# Patient Record
Sex: Female | Born: 1970
Health system: Southern US, Community
[De-identification: ages and names within clinical notes are randomized; demographics above are authoritative.]

## PROBLEM LIST (undated history)

## (undated) DIAGNOSIS — D649 Anemia, unspecified: Secondary | ICD-10-CM

## (undated) DIAGNOSIS — K4090 Unilateral inguinal hernia, without obstruction or gangrene, not specified as recurrent: Secondary | ICD-10-CM

## (undated) DIAGNOSIS — N200 Calculus of kidney: Secondary | ICD-10-CM

## (undated) HISTORY — DX: Unilateral inguinal hernia, without obstruction or gangrene, not specified as recurrent: K40.90

## (undated) HISTORY — DX: Calculus of kidney: N20.0

---

## 2017-09-10 DIAGNOSIS — M545 Low back pain: Secondary | ICD-10-CM | POA: Diagnosis not present

## 2018-04-07 DIAGNOSIS — M545 Low back pain: Secondary | ICD-10-CM | POA: Diagnosis not present

## 2019-07-10 DIAGNOSIS — N7093 Salpingitis and oophoritis, unspecified: Secondary | ICD-10-CM

## 2019-07-10 HISTORY — DX: Salpingitis and oophoritis, unspecified: N70.93

## 2019-07-25 ENCOUNTER — Inpatient Hospital Stay (HOSPITAL_COMMUNITY): Payer: BC Managed Care – PPO

## 2019-07-25 ENCOUNTER — Inpatient Hospital Stay (HOSPITAL_COMMUNITY)
Admission: EM | Admit: 2019-07-25 | Discharge: 2019-07-27 | DRG: 759 | Disposition: A | Discharge status: Home / Self Care | Payer: BC Managed Care – PPO | Attending: Obstetrics & Gynecology | Admitting: Obstetrics & Gynecology

## 2019-07-25 ENCOUNTER — Emergency Department (HOSPITAL_COMMUNITY): Payer: BC Managed Care – PPO

## 2019-07-25 ENCOUNTER — Encounter (HOSPITAL_COMMUNITY): Payer: Self-pay

## 2019-07-25 ENCOUNTER — Other Ambulatory Visit: Payer: Self-pay

## 2019-07-25 DIAGNOSIS — D219 Benign neoplasm of connective and other soft tissue, unspecified: Secondary | ICD-10-CM | POA: Diagnosis present

## 2019-07-25 DIAGNOSIS — D649 Anemia, unspecified: Secondary | ICD-10-CM | POA: Diagnosis not present

## 2019-07-25 DIAGNOSIS — Z880 Allergy status to penicillin: Secondary | ICD-10-CM

## 2019-07-25 DIAGNOSIS — N83201 Unspecified ovarian cyst, right side: Secondary | ICD-10-CM | POA: Diagnosis not present

## 2019-07-25 DIAGNOSIS — Z20828 Contact with and (suspected) exposure to other viral communicable diseases: Secondary | ICD-10-CM | POA: Diagnosis not present

## 2019-07-25 DIAGNOSIS — N7093 Salpingitis and oophoritis, unspecified: Principal | ICD-10-CM

## 2019-07-25 DIAGNOSIS — R109 Unspecified abdominal pain: Secondary | ICD-10-CM | POA: Diagnosis not present

## 2019-07-25 DIAGNOSIS — D259 Leiomyoma of uterus, unspecified: Secondary | ICD-10-CM | POA: Diagnosis not present

## 2019-07-25 DIAGNOSIS — N920 Excessive and frequent menstruation with regular cycle: Secondary | ICD-10-CM | POA: Diagnosis not present

## 2019-07-25 HISTORY — DX: Anemia, unspecified: D64.9

## 2019-07-25 LAB — COMPREHENSIVE METABOLIC PANEL
ALT: 39 U/L (ref 0–44)
AST: 40 U/L (ref 15–41)
Albumin: 3.7 g/dL (ref 3.5–5.0)
Alkaline Phosphatase: 82 U/L (ref 38–126)
Anion gap: 13 (ref 5–15)
BUN: 10 mg/dL (ref 6–20)
CO2: 20 mmol/L — ABNORMAL LOW (ref 22–32)
Calcium: 9.2 mg/dL (ref 8.9–10.3)
Chloride: 101 mmol/L (ref 98–111)
Creatinine, Ser: 0.48 mg/dL (ref 0.44–1.00)
GFR calc Af Amer: >60 mL/min (ref >60)
GFR calc non Af Amer: >60 mL/min (ref >60)
Glucose, Bld: 114 mg/dL — ABNORMAL HIGH (ref 70–99)
Potassium: 3.6 mmol/L (ref 3.5–5.1)
Sodium: 134 mmol/L — ABNORMAL LOW (ref 135–145)
Total Bilirubin: 0.9 mg/dL (ref 0.3–1.2)
Total Protein: 6.9 g/dL (ref 6.5–8.1)

## 2019-07-25 LAB — URINALYSIS, ROUTINE W REFLEX MICROSCOPIC
Bilirubin Urine: NEGATIVE
Glucose, UA: NEGATIVE mg/dL
Ketones, ur: 80 mg/dL — AB
Leukocytes,Ua: NEGATIVE
Nitrite: NEGATIVE
Protein, ur: 30 mg/dL — AB
Specific Gravity, Urine: 1.02 (ref 1.005–1.030)
pH: 5 (ref 5.0–8.0)

## 2019-07-25 LAB — LACTIC ACID, PLASMA
Lactic Acid, Venous: 1.3 mmol/L (ref 0.5–1.9)
Lactic Acid, Venous: 1.2 mmol/L (ref 0.5–1.9)

## 2019-07-25 LAB — ABO/RH: ABO/RH(D): O POS

## 2019-07-25 LAB — LIPASE, BLOOD: Lipase: 22 U/L (ref 11–51)

## 2019-07-25 LAB — WET PREP, GENITAL
Clue Cells Wet Prep HPF POC: NONE SEEN
Sperm: NONE SEEN
Trich, Wet Prep: NONE SEEN
Yeast Wet Prep HPF POC: NONE SEEN

## 2019-07-25 LAB — CBC
HCT: 30.6 % — ABNORMAL LOW (ref 36.0–46.0)
Hemoglobin: 7.8 g/dL — ABNORMAL LOW (ref 12.0–15.0)
MCH: 17.7 pg — ABNORMAL LOW (ref 26.0–34.0)
MCHC: 25.5 g/dL — ABNORMAL LOW (ref 30.0–36.0)
MCV: 69.4 fL — ABNORMAL LOW (ref 80.0–100.0)
Platelets: 352 10*3/uL (ref 150–400)
RBC: 4.41 MIL/uL (ref 3.87–5.11)
RDW: 18.3 % — ABNORMAL HIGH (ref 11.5–15.5)
WBC: 24.6 10*3/uL — ABNORMAL HIGH (ref 4.0–10.5)
nRBC: 0 % (ref 0.0–0.2)

## 2019-07-25 LAB — I-STAT BETA HCG BLOOD, ED (MC, WL, AP ONLY): I-stat hCG, quantitative: 82.1 m[IU]/mL — ABNORMAL HIGH (ref <5)

## 2019-07-25 LAB — PREPARE RBC (CROSSMATCH)

## 2019-07-25 LAB — SARS CORONAVIRUS 2 (TAT 6-24 HRS): SARS Coronavirus 2: NEGATIVE

## 2019-07-25 LAB — PREGNANCY, URINE: Preg Test, Ur: NEGATIVE

## 2019-07-25 MED ORDER — CLINDAMYCIN PHOSPHATE 900 MG/50ML IV SOLN
900.0000 mg | Freq: Three times a day (TID) | INTRAVENOUS | Status: DC
  Administered 2019-07-25 – 2019-07-27 (×6): 900 mg via INTRAVENOUS
  Filled 2019-07-25 (×6): qty 50

## 2019-07-25 MED ORDER — SODIUM CHLORIDE 0.9 % IV BOLUS
1000.0000 mL | Freq: Once | INTRAVENOUS | Status: AC
  Administered 2019-07-25: 1000 mL via INTRAVENOUS

## 2019-07-25 MED ORDER — MORPHINE SULFATE (PF) 4 MG/ML IV SOLN
4.0000 mg | Freq: Once | INTRAVENOUS | Status: AC
  Administered 2019-07-25: 4 mg via INTRAVENOUS
  Filled 2019-07-25: qty 1

## 2019-07-25 MED ORDER — ZOLPIDEM TARTRATE 5 MG PO TABS: 5.0000 mg | ORAL_TABLET | Freq: Every evening | ORAL | Status: DC | PRN

## 2019-07-25 MED ORDER — LACTATED RINGERS IV SOLN
INTRAVENOUS | Status: DC
  Administered 2019-07-25 – 2019-07-27 (×3): via INTRAVENOUS

## 2019-07-25 MED ORDER — GENTAMICIN SULFATE 40 MG/ML IJ SOLN
330.0000 mg | INTRAVENOUS | Status: DC
  Administered 2019-07-25 – 2019-07-26 (×2): 330 mg via INTRAVENOUS
  Filled 2019-07-25 (×4): qty 8.25

## 2019-07-25 MED ORDER — KETOROLAC TROMETHAMINE 30 MG/ML IJ SOLN
30.0000 mg | Freq: Four times a day (QID) | INTRAMUSCULAR | Status: AC
  Administered 2019-07-25 – 2019-07-26 (×3): 30 mg via INTRAVENOUS
  Filled 2019-07-25 (×2): qty 1

## 2019-07-25 MED ORDER — ACETAMINOPHEN 500 MG PO TABS
1000.0000 mg | ORAL_TABLET | Freq: Once | ORAL | Status: AC
  Administered 2019-07-25: 1000 mg via ORAL
  Filled 2019-07-25: qty 2

## 2019-07-25 MED ORDER — SODIUM CHLORIDE 0.9 % IV SOLN: 10.0000 mL/h | Freq: Once | INTRAVENOUS | Status: DC

## 2019-07-25 MED ORDER — OXYCODONE-ACETAMINOPHEN 5-325 MG PO TABS
1.0000 | ORAL_TABLET | ORAL | Status: DC | PRN
  Administered 2019-07-26: 1 via ORAL
  Administered 2019-07-26: 2 via ORAL
  Administered 2019-07-26 (×2): 1 via ORAL
  Administered 2019-07-27: 2 via ORAL
  Filled 2019-07-25: qty 1
  Filled 2019-07-25: qty 2
  Filled 2019-07-25 (×2): qty 1
  Filled 2019-07-25: qty 2

## 2019-07-25 MED ORDER — ONDANSETRON HCL 4 MG/2ML IJ SOLN: 4.0000 mg | Freq: Four times a day (QID) | INTRAMUSCULAR | Status: DC | PRN

## 2019-07-25 MED ORDER — ADULT MULTIVITAMIN W/MINERALS CH
1.0000 | ORAL_TABLET | Freq: Every day | ORAL | Status: DC
  Administered 2019-07-25 – 2019-07-27 (×3): 1 via ORAL
  Filled 2019-07-25 (×3): qty 1

## 2019-07-25 MED ORDER — IOHEXOL 300 MG/ML  SOLN
100.0000 mL | Freq: Once | INTRAMUSCULAR | Status: AC | PRN
  Administered 2019-07-25: 100 mL via INTRAVENOUS

## 2019-07-25 MED ORDER — DOCUSATE SODIUM 100 MG PO CAPS
100.0000 mg | ORAL_CAPSULE | Freq: Two times a day (BID) | ORAL | Status: DC
  Administered 2019-07-26 – 2019-07-27 (×3): 100 mg via ORAL
  Filled 2019-07-25 (×3): qty 1

## 2019-07-25 MED ORDER — ONDANSETRON HCL 4 MG PO TABS: 4.0000 mg | ORAL_TABLET | Freq: Four times a day (QID) | ORAL | Status: DC | PRN

## 2019-07-25 MED ORDER — IBUPROFEN 600 MG PO TABS: 600.0000 mg | ORAL_TABLET | Freq: Four times a day (QID) | ORAL | Status: DC | PRN

## 2019-07-25 MED ORDER — ALUM & MAG HYDROXIDE-SIMETH 200-200-20 MG/5ML PO SUSP: 30.0000 mL | ORAL | Status: DC | PRN

## 2019-07-25 MED ORDER — DIPHENHYDRAMINE HCL 25 MG PO CAPS
25.0000 mg | ORAL_CAPSULE | Freq: Once | ORAL | Status: AC
  Administered 2019-07-25: 25 mg via ORAL
  Filled 2019-07-25: qty 1

## 2019-07-25 MED ORDER — KETOROLAC TROMETHAMINE 30 MG/ML IJ SOLN
30.0000 mg | Freq: Four times a day (QID) | INTRAMUSCULAR | Status: AC
  Filled 2019-07-25: qty 1

## 2019-07-25 MED ORDER — MAGNESIUM HYDROXIDE 400 MG/5ML PO SUSP: 30.0000 mL | Freq: Every day | ORAL | Status: DC | PRN

## 2019-07-25 MED ORDER — SODIUM CHLORIDE 0.9% IV SOLUTION: Freq: Once | INTRAVENOUS | Status: DC

## 2019-07-25 MED ORDER — HYDROMORPHONE HCL 1 MG/ML IJ SOLN: 0.5000 mg | INTRAMUSCULAR | Status: DC | PRN

## 2019-07-25 MED ORDER — BISACODYL 5 MG PO TBEC
5.0000 mg | DELAYED_RELEASE_TABLET | Freq: Every day | ORAL | Status: DC | PRN
  Administered 2019-07-27: 5 mg via ORAL
  Filled 2019-07-25: qty 1

## 2019-07-25 MED ORDER — MAGNESIUM CITRATE PO SOLN: 1.0000 | Freq: Once | ORAL | Status: DC | PRN

## 2019-07-25 NOTE — H&P (Signed)
[facility] Attending History and Physical  Maria Whitehead is a 48 y.o. F at who presented to [facility] today for evaluation of abdominal pain and fevers that started 07/24/19 morning.  Denies any associated abnormal vaginal bleeding, dysuria, nausea, vomiting, respiratory symptoms, other GI or GU symptoms or other general symptoms. In the ED, she was found to have a temperature of 99.7, and significant lower abdominal pain, and yellow vaginal discharge.  WBC was 24.6, Hemoglobin 7.8, normal lactic acid, neg UHCG (confounding positive i-Stat HCG, likely false positive) and negative wet prep. CT scan showed fibroid uterus, but right ovarian cyst with surrounding inflammation consistent with PID/abscess. Our service was consulted for management.   On my encounter with patient, she was in room with her husband at her side. She reported continued pain, and feeling "hot". Reports history of fibroids for many years, but no GYN follow up.  Reports heavy menstrual periods and feeling lightheaded and tired occasionally, but did not get evaluated for this.  Cannot remember when last she had a pap smear, never had a mammogram.  Denies any other gynecologic diagnoses in the past.    History reviewed. No pertinent past medical history.   History reviewed. No pertinent surgical history.   OB History  No obstetric history on file.  Patient denies any other pertinent gynecologic issues. Does not remember last pap.   No current facility-administered medications on file prior to encounter.    Current Outpatient Medications on File Prior to Encounter  Medication Sig Dispense Refill  . acetaminophen (TYLENOL) 500 MG tablet Take 1,000 mg by mouth every 6 (six) hours as needed for mild pain.    Marland Kitchen ibuprofen (ADVIL) 200 MG tablet Take 600-800 mg by mouth 2 (two) times daily.    . Multiple Vitamin (MULTIVITAMIN WITH MINERALS) TABS tablet Take 1 tablet by mouth daily.     Allergies  Allergen  Reactions  . Penicillins Anaphylaxis    Did it involve swelling of the face/tongue/throat, SOB, or low BP? Yes Did it involve sudden or severe rash/hives, skin peeling, or any reaction on the inside of your mouth or nose? No Did you need to seek medical attention at a hospital or doctor's office? Yes When did it last happen?Pt was a child  If all above answers are "NO", may proceed with cephalosporin use.     Social History:   reports that she has never smoked. She does not have any smokeless tobacco history on file. She reports current alcohol use. She reports that she does not use drugs. History reviewed. No pertinent family history.  Review of Systems: Pertinent items noted in HPI and remainder of comprehensive ROS otherwise negative.  PHYSICAL EXAM: Blood pressure 128/61, pulse (!) 112, temperature 99.7 F (37.6 C), temperature source Oral, resp. rate 14, last menstrual period 07/08/2019, SpO2 99 %. CONSTITUTIONAL: Well-developed, well-nourished female in no acute distress.  HENT:  Normocephalic, atraumatic, External right and left ear normal. Oropharynx is clear and moist EYES: Conjunctivae and EOM are normal. Pupils are equal, round, and reactive to light. No scleral icterus.  NECK: Normal range of motion, supple, no masses SKIN: Skin is warm and dry. No rash noted. Not diaphoretic. No erythema. No pallor. NEUROLOGIC: Alert and oriented to person, place, and time. Normal reflexes, muscle tone coordination. No cranial nerve deficit noted. PSYCHIATRIC: Normal mood and affect. Normal behavior. Normal judgment and thought content. CARDIOVASCULAR: Elevated heart rate noted, regular rhythm RESPIRATORY: Effort and breath sounds normal, no  problems with respiration noted ABDOMEN: Soft, nondistended, 20 week fibroid uterus palpated with diffuse lower abdominal tenderness, no rebound or guarding. PELVIC: Deferred for now MUSCULOSKELETAL: Normal range of motion. No tenderness.  No  cyanosis, clubbing, or edema.  2+ distal pulses.  Labs: Results for orders placed or performed during the hospital encounter of 07/25/19 (from the past 336 hour(s))  Lipase, blood   Collection Time: 07/25/19 10:14 AM  Result Value Ref Range   Lipase 22 11 - 51 U/L  Comprehensive metabolic panel   Collection Time: 07/25/19 10:14 AM  Result Value Ref Range   Sodium 134 (L) 135 - 145 mmol/L   Potassium 3.6 3.5 - 5.1 mmol/L   Chloride 101 98 - 111 mmol/L   CO2 20 (L) 22 - 32 mmol/L   Glucose, Bld 114 (H) 70 - 99 mg/dL   BUN 10 6 - 20 mg/dL   Creatinine, Ser 0.48 0.44 - 1.00 mg/dL   Calcium 9.2 8.9 - 10.3 mg/dL   Total Protein 6.9 6.5 - 8.1 g/dL   Albumin 3.7 3.5 - 5.0 g/dL   AST 40 15 - 41 U/L   ALT 39 0 - 44 U/L   Alkaline Phosphatase 82 38 - 126 U/L   Total Bilirubin 0.9 0.3 - 1.2 mg/dL   GFR calc non Af Amer >60 >60 mL/min   GFR calc Af Amer >60 >60 mL/min   Anion gap 13 5 - 15  CBC   Collection Time: 07/25/19 10:14 AM  Result Value Ref Range   WBC 24.6 (H) 4.0 - 10.5 K/uL   RBC 4.41 3.87 - 5.11 MIL/uL   Hemoglobin 7.8 (L) 12.0 - 15.0 g/dL   HCT 30.6 (L) 36.0 - 46.0 %   MCV 69.4 (L) 80.0 - 100.0 fL   MCH 17.7 (L) 26.0 - 34.0 pg   MCHC 25.5 (L) 30.0 - 36.0 g/dL   RDW 18.3 (H) 11.5 - 15.5 %   Platelets 352 150 - 400 K/uL   nRBC 0.0 0.0 - 0.2 %  Lactic acid, plasma   Collection Time: 07/25/19 10:14 AM  Result Value Ref Range   Lactic Acid, Venous 1.3 0.5 - 1.9 mmol/L  I-Stat beta hCG blood, ED   Collection Time: 07/25/19 10:24 AM  Result Value Ref Range   I-stat hCG, quantitative 82.1 (H) <5 mIU/mL   Comment 3          Pregnancy, urine   Collection Time: 07/25/19 11:44 AM  Result Value Ref Range   Preg Test, Ur NEGATIVE NEGATIVE  Urinalysis, Routine w reflex microscopic   Collection Time: 07/25/19 12:59 PM  Result Value Ref Range   Color, Urine YELLOW YELLOW   APPearance HAZY (A) CLEAR   Specific Gravity, Urine 1.020 1.005 - 1.030   pH 5.0 5.0 - 8.0   Glucose,  UA NEGATIVE NEGATIVE mg/dL   Hgb urine dipstick SMALL (A) NEGATIVE   Bilirubin Urine NEGATIVE NEGATIVE   Ketones, ur 80 (A) NEGATIVE mg/dL   Protein, ur 30 (A) NEGATIVE mg/dL   Nitrite NEGATIVE NEGATIVE   Leukocytes,Ua NEGATIVE NEGATIVE   RBC / HPF 0-5 0 - 5 RBC/hpf   WBC, UA 0-5 0 - 5 WBC/hpf   Bacteria, UA RARE (A) NONE SEEN   Squamous Epithelial / LPF 0-5 0 - 5   Mucus PRESENT   Lactic acid, plasma   Collection Time: 07/25/19  1:15 PM  Result Value Ref Range   Lactic Acid, Venous 1.2 0.5 - 1.9 mmol/L  Imaging Studies: Ct Abdomen Pelvis W Contrast  Result Date: 07/25/2019 CLINICAL DATA:  Acute lower abdominal pain since this morning with chills and low-grade fever. EXAM: CT ABDOMEN AND PELVIS WITH CONTRAST TECHNIQUE: Multidetector CT imaging of the abdomen and pelvis was performed using the standard protocol following bolus administration of intravenous contrast. CONTRAST:  115mL OMNIPAQUE IOHEXOL 300 MG/ML  SOLN COMPARISON:  None. FINDINGS: Lower chest: Streaky bibasilar atelectasis but no infiltrates or effusions. The heart is within normal limits in size. No pericardial effusion. The distal esophagus is grossly normal. Hepatobiliary: No focal hepatic lesions or intrahepatic biliary dilatation. The portal and hepatic veins are patent. The gallbladder is unremarkable. Slightly prominent common bile duct measuring a maximum of 9 mm in the porta hepatis. Is also 9 mm in the head of the pancreas. I do not see an obvious obstructing pancreatic head mass, common bile duct stone or ampullary lesion. Recommend correlation with liver function studies. If these are abnormal patient may need ERCP or MRCP for further evaluation. Pancreas: No mass, inflammation or ductal dilatation. Spleen: Normal size.  No focal lesions. Adrenals/Urinary Tract: The adrenal glands are normal. There are bilateral midpole renal calculi but I do not see any obstructing ureteral calculi or bladder calculi. Duplicated  collecting system on the right side is noted but the ureters join quickly proximally. Stomach/Bowel: The stomach, duodenum, small bowel and colon are grossly normal without oral contrast. No acute inflammatory changes, mass lesions or obstructive findings. The terminal ileum is normal. The appendix is normal. There is diffuse colonic diverticulosis but no definite findings for acute diverticulitis. Vascular/Lymphatic: The aorta and branch vessels are unremarkable. The major venous structures are patent. Circumaortic left renal vein is noted. There are borderline enlarged retroperitoneal lymph nodes along with some hazy interstitial changes. This is likely inflammatory/infectious adenopathy. Reproductive: Markedly enlarged fibroid uterus with a dominant fibroid involving the anterior myometrium and measuring 9.8 x 8.2 x 10 cm. It demonstrates some component of degeneration. There is moderate compression and flattening of the endometrium posteriorly. The left ovary appears normal. There is a small cyst or follicle noted. The right ovary appears to contain a simple cyst measuring 3.3 cm. However, around the right ovary is a sizable area of PID or abscess. Irregular fluid collections surrounding parametrial vessels and rim like enhancement suggesting tubo-ovarian abscess or PID. Other: Small scattered pelvic nodes but no adenopathy. The patient is a small right inguinal hernia containing fluid. Musculoskeletal: No significant bony findings. IMPRESSION: 1. CT findings highly suspicious for right-sided tubo-ovarian abscess or PID. Several likely connected fluid collection surrounding the right ovary which contains a simple appearing cyst. There is also moderate inflammatory interstitial changes around this area and extending up around the right ovarian vein and into the retroperitoneum. 2. Bilateral renal calculi but no obstructing ureteral calculi or findings for pyelonephritis. 3. Common bile duct dilatation of  uncertain significance. Recommend correlation with liver function studies. If these are abnormal patient may need MRCP for further evaluation. No obvious gallstones or findings for acute cholecystitis. 4. Markedly enlarged fibroid uterus with a large dominant fibroid in the anterior myometrium. Electronically Signed   By: Marijo Sanes M.D.   On: 07/25/2019 16:16   Dg Abd Portable 1 View  Result Date: 07/25/2019 CLINICAL DATA:  Abdominal pain EXAM: PORTABLE ABDOMEN - 1 VIEW COMPARISON:  None. FINDINGS: Bowel gas pattern is unremarkable. There is a 4 mm radiopaque density overlying the left abdomen, which could reflect a renal calculus. Osseous structures are  unremarkable. IMPRESSION: Normal bowel gas pattern.  Possible 4 mm left renal stone. Electronically Signed   By: Macy Mis M.D.   On: 07/25/2019 12:45    Assessment: Principal Problem:   TOA (tubo-ovarian abscess) Active Problems:   Fibroids   Menorrhagia   Symptomatic anemia   Plan: [facility] to [facility] Clindamycin and Gentamicin ordered for TOA/PID given PCN severe allergy. Analgesia ordered as needed Transfuse with 2 units of pRBCs for symptomatic anemia, patient was consented for this. Routine floor care   Verita Schneiders, MD, [place], [facility] for [employer], [place]

## 2019-07-25 NOTE — Progress Notes (Signed)
Pharmacy Antibiotic Note  Maria Whitehead is a 48 y.o. female admitted on 07/25/2019 with PID/TOA.  Pharmacy has been consulted for Gentamicin dosing.  Height: 5\' 9"  (175.3 cm) Weight: 167 lb (75.8 kg) IBW/kg (Calculated) : 66.2  Temp (24hrs), Avg:99.7 F (37.6 C), Min:99.7 F (37.6 C), Max:99.7 F (37.6 C)  Recent Labs  Lab 07/25/19 1014 07/25/19 1315  WBC 24.6*  --   CREATININE 0.48  --   LATICACIDVEN 1.3 1.2    Estimated Creatinine Clearance: 88.9 mL/min (by C-G formula based on SCr of 0.48 mg/dL).    Allergies  Allergen Reactions  . Penicillins Anaphylaxis    Did it involve swelling of the face/tongue/throat, SOB, or low BP? Yes Did it involve sudden or severe rash/hives, skin peeling, or any reaction on the inside of your mouth or nose? No Did you need to seek medical attention at a hospital or doctor's office? Yes When did it last happen?Pt was a child  If all above answers are "NO", may proceed with cephalosporin use.     Antimicrobials this admission: 11/16 Clindamycin >>  11/16 Gentamicin >>   Microbiology results: 1/16 BCx: Pending 11/16 Wet Prep: Pending  Plan: - Data suggest that once daily Gentamicin dosing for PID/TOA treatment is as effective with less adverse effects. Will use once daily doing for Gentamicin dosing.  - Gentamicin 330 mg (5mg /kg IBW) IV q 24 hr  - Will obtain a Gentamicin level 8 hours after infusion to ensure q24hr is appropriate - Monitor patient's renal function to determine if dosing interval should be adjusted   Thank you for allowing pharmacy to be a part of this patient's care.   Duanne Limerick PharmD. BCPS  07/25/2019 5:42 PM

## 2019-07-25 NOTE — ED Triage Notes (Signed)
Pt endorses mid lower abd pain since yesterday morning with chills and low grade fever. Tachy in triage. 99.7 oral temp. Denies n/v/d.

## 2019-07-25 NOTE — ED Provider Notes (Signed)
[facility] DEPART[facility] Provider Note   CSN: [id number] Arrival date & time: 07/25/19  1003     History   Chief Complaint Chief Complaint  Patient presents with   Abdominal Pain    HPI Maria Whitehead is a 48 y.o. female who presents for evaluation of mid and lower abdominal pain that began yesterday morning.  She reports that about 4:30 AM, she was woken up from sleep with a cramping lower abdominal pain.  She states that the pain continued to persist and eventually started progressively worsening.  She now states that the pain is more sharp in nature and radiates up to her mid abdomen.  She states that she felt like it was worse when she moved and bent.  She denies any other aggravating factors.  She currently states the pain is a 4/10 but she will occasionally get severe sharp shooting pains and is worse when she moves.  If she sits still, she feels more comfortable.  She has not noted any associated nausea or vomiting.  She does state that since the pain began yesterday, she has had decreased appetite.  She was able to tolerate a small amount of black eyed peas yesterday.  She states she has had some chills.  She states yesterday, she took her temperature and noted it to be 100.4 but she thought that was because it was an old thermometer.  She reports checking it later and states that her repeat temperature was 99.6.  She has not taken any Tylenol or ibuprofen.  She states she had a bowel movement yesterday which was normal.  No blood noted in stools.  She states she went to urgent care today when symptoms would not improve and they prompted her to go to the emergency department for further evaluation.  She denies any recent travel or known COVID-19 exposure.  She denies any chest pain, difficulty breathing, cough, dysuria, hematuria, vaginal bleeding.     The history is provided by the patient.    History reviewed. No pertinent past medical history.  Patient  Active Problem List   Diagnosis Date Noted   TOA (tubo-ovarian abscess) 07/25/2019    History reviewed. No pertinent surgical history.   OB History   No obstetric history on file.      Home Medications    Prior to Admission medications   Medication Sig Start Date End Date Taking? Authorizing Provider  acetaminophen (TYLENOL) 500 MG tablet Take 1,000 mg by mouth every 6 (six) hours as needed for mild pain.   Yes [provider]  ibuprofen (ADVIL) 200 MG tablet Take 600-800 mg by mouth 2 (two) times daily.   Yes [provider]  Multiple Vitamin (MULTIVITAMIN WITH MINERALS) TABS tablet Take 1 tablet by mouth daily.   Yes [provider]    Family History History reviewed. No pertinent family history.  Social History Social History   Tobacco Use   Smoking status: Never Smoker  Substance Use Topics   Alcohol use: Yes    Frequency: Never    Comment: every other day   Drug use: Never     Allergies   Penicillins   Review of Systems Review of Systems  Constitutional: Positive for appetite change, chills and fever.  Respiratory: Negative for cough and shortness of breath.   Cardiovascular: Negative for chest pain.  Gastrointestinal: Positive for abdominal pain. Negative for blood in stool, nausea and vomiting.  Genitourinary: Negative for dysuria and hematuria.  Neurological:  Negative for headaches.  All other systems reviewed and are negative.    Physical Exam Updated Vital Signs BP 128/61    Pulse (!) 112    Temp 99.7 F (37.6 C) (Oral)    Resp 14    LMP 07/08/2019 (Exact Date)    SpO2 99%   Physical Exam Vitals signs and nursing note reviewed. Exam conducted with a chaperone present.  Constitutional:      Appearance: Normal appearance. She is well-developed.     Comments: Appears uncomfortable but NAD  HENT:     Head: Normocephalic and atraumatic.  Eyes:     General: Lids are normal.     Conjunctiva/sclera: Conjunctivae  normal.     Pupils: Pupils are equal, round, and reactive to light.  Neck:     Musculoskeletal: Full passive range of motion without pain.  Cardiovascular:     Rate and Rhythm: Regular rhythm. Tachycardia present.     Pulses: Normal pulses.     Heart sounds: Normal heart sounds. No murmur. No friction rub. No gallop.   Pulmonary:     Effort: Pulmonary effort is normal.     Breath sounds: Normal breath sounds.     Comments: Lungs clear to auscultation bilaterally.  Symmetric chest rise.  No wheezing, rales, rhonchi. Abdominal:     General: There is distension.     Palpations: Abdomen is soft. Abdomen is not rigid.     Tenderness: There is no abdominal tenderness. There is left CVA tenderness. There is no guarding.     Comments: Slight abdominal distension noted to the lower abdomen.  Tenderness noted diffusely to the lower abdomen, left greater than right.  She does have some focal tenderness in the left lower quadrant.  She has some diffuse tenderness noted.  Billable region.  She does have some voluntary guarding noted.  No rigidity.  Left-sided CVA tenderness noted.  Genitourinary:    Cervix: Discharge present. No cervical motion tenderness.     Comments: The exam was performed with a chaperone present. Normal external female genitalia. No lesions, rash, or sores.  There is yellow discharge noted from cervix.  No CMT.  She does have right adnexal tenderness. Musculoskeletal: Normal range of motion.  Skin:    General: Skin is warm and dry.     Capillary Refill: Capillary refill takes less than 2 seconds.  Neurological:     Mental Status: She is alert and oriented to person, place, and time.  Psychiatric:        Speech: Speech normal.      ED Treatments / Results  Labs (all labs ordered are listed, but only abnormal results are displayed) Labs Reviewed  COMPREHENSIVE METABOLIC PANEL - Abnormal; Notable for the following components:      Result Value   Sodium 134 (*)    CO2 20  (*)    Glucose, Bld 114 (*)    All other components within normal limits  CBC - Abnormal; Notable for the following components:   WBC 24.6 (*)    Hemoglobin 7.8 (*)    HCT 30.6 (*)    MCV 69.4 (*)    MCH 17.7 (*)    MCHC 25.5 (*)    RDW 18.3 (*)    All other components within normal limits  URINALYSIS, ROUTINE W REFLEX MICROSCOPIC - Abnormal; Notable for the following components:   APPearance HAZY (*)    Hgb urine dipstick SMALL (*)    Ketones, ur 80 (*)  Protein, ur 30 (*)    Bacteria, UA RARE (*)    All other components within normal limits  I-STAT BETA HCG BLOOD, ED ([facility], [facility], [facility] ONLY) - Abnormal; Notable for the following components:   I-stat hCG, quantitative 82.1 (*)    All other components within normal limits  CULTURE, BLOOD (ROUTINE X 2)  CULTURE, BLOOD (ROUTINE X 2)  WET PREP, GENITAL  SARS CORONAVIRUS 2 (TAT 6-24 HRS)  LIPASE, BLOOD  LACTIC ACID, PLASMA  LACTIC ACID, PLASMA  PREGNANCY, URINE  GC/CHLAMYDIA PROBE AMP ([place]) NOT AT [facility]    EKG None  Radiology Ct Abdomen Pelvis W Contrast  Result Date: 07/25/2019 CLINICAL DATA:  Acute lower abdominal pain since this morning with chills and low-grade fever. EXAM: CT ABDOMEN AND PELVIS WITH CONTRAST TECHNIQUE: Multidetector CT imaging of the abdomen and pelvis was performed using the standard protocol following bolus administration of intravenous contrast. CONTRAST:  172mL OMNIPAQUE IOHEXOL 300 MG/ML  SOLN COMPARISON:  None. FINDINGS: Lower chest: Streaky bibasilar atelectasis but no infiltrates or effusions. The heart is within normal limits in size. No pericardial effusion. The distal esophagus is grossly normal. Hepatobiliary: No focal hepatic lesions or intrahepatic biliary dilatation. The portal and hepatic veins are patent. The gallbladder is unremarkable. Slightly prominent common bile duct measuring a maximum of 9 mm in the porta hepatis. Is also 9 mm in the head of the pancreas. I do not see an obvious  obstructing pancreatic head mass, common bile duct stone or ampullary lesion. Recommend correlation with liver function studies. If these are abnormal patient may need ERCP or MRCP for further evaluation. Pancreas: No mass, inflammation or ductal dilatation. Spleen: Normal size.  No focal lesions. Adrenals/Urinary Tract: The adrenal glands are normal. There are bilateral midpole renal calculi but I do not see any obstructing ureteral calculi or bladder calculi. Duplicated collecting system on the right side is noted but the ureters join quickly proximally. Stomach/Bowel: The stomach, duodenum, small bowel and colon are grossly normal without oral contrast. No acute inflammatory changes, mass lesions or obstructive findings. The terminal ileum is normal. The appendix is normal. There is diffuse colonic diverticulosis but no definite findings for acute diverticulitis. Vascular/Lymphatic: The aorta and branch vessels are unremarkable. The major venous structures are patent. Circumaortic left renal vein is noted. There are borderline enlarged retroperitoneal lymph nodes along with some hazy interstitial changes. This is likely inflammatory/infectious adenopathy. Reproductive: Markedly enlarged fibroid uterus with a dominant fibroid involving the anterior myometrium and measuring 9.8 x 8.2 x 10 cm. It demonstrates some component of degeneration. There is moderate compression and flattening of the endometrium posteriorly. The left ovary appears normal. There is a small cyst or follicle noted. The right ovary appears to contain a simple cyst measuring 3.3 cm. However, around the right ovary is a sizable area of PID or abscess. Irregular fluid collections surrounding parametrial vessels and rim like enhancement suggesting tubo-ovarian abscess or PID. Other: Small scattered pelvic nodes but no adenopathy. The patient is a small right inguinal hernia containing fluid. Musculoskeletal: No significant bony findings. IMPRESSION:  1. CT findings highly suspicious for right-sided tubo-ovarian abscess or PID. Several likely connected fluid collection surrounding the right ovary which contains a simple appearing cyst. There is also moderate inflammatory interstitial changes around this area and extending up around the right ovarian vein and into the retroperitoneum. 2. Bilateral renal calculi but no obstructing ureteral calculi or findings for pyelonephritis. 3. Common bile duct dilatation of uncertain significance.  Recommend correlation with liver function studies. If these are abnormal patient may need MRCP for further evaluation. No obvious gallstones or findings for acute cholecystitis. 4. Markedly enlarged fibroid uterus with a large dominant fibroid in the anterior myometrium. Electronically Signed   By: Marijo Sanes M.D.   On: 07/25/2019 16:16   Dg Abd Portable 1 View  Result Date: 07/25/2019 CLINICAL DATA:  Abdominal pain EXAM: PORTABLE ABDOMEN - 1 VIEW COMPARISON:  None. FINDINGS: Bowel gas pattern is unremarkable. There is a 4 mm radiopaque density overlying the left abdomen, which could reflect a renal calculus. Osseous structures are unremarkable. IMPRESSION: Normal bowel gas pattern.  Possible 4 mm left renal stone. Electronically Signed   By: Macy Mis M.D.   On: 07/25/2019 12:45    Procedures .Critical Care Performed by: Volanda Napoleon, PA-C Authorized by: Volanda Napoleon, PA-C   Critical care provider statement:    Critical care time (minutes):  45   Critical care was necessary to treat or prevent imminent or life-threatening deterioration of the following conditions: TOA, Anemia.   Critical care was time spent personally by me on the following activities:  Discussions with consultants, evaluation of patient's response to treatment, examination of patient, ordering and performing treatments and interventions, ordering and review of laboratory studies, ordering and review of radiographic studies, pulse  oximetry, re-evaluation of patient's condition, obtaining history from patient or surrogate and review of old charts   (including critical care time)  Medications Ordered in ED Medications  sodium chloride 0.9 % bolus 1,000 mL (0 mLs Intravenous Stopped 07/25/19 1636)  morphine 4 MG/ML injection 4 mg (4 mg Intravenous Given 07/25/19 1326)  morphine 4 MG/ML injection 4 mg (4 mg Intravenous Given 07/25/19 1640)  iohexol (OMNIPAQUE) 300 MG/ML solution 100 mL (100 mLs Intravenous Contrast Given 07/25/19 1530)     Initial Impression / Assessment and Plan / ED Course  I have reviewed the triage vital signs and the nursing notes.  Pertinent labs & imaging results that were available during my care of the patient were reviewed by me and considered in my medical decision making (see chart for details).        48 year old female who presents for evaluation of abdominal pain that began yesterday about 4:30 AM.  She reports she was woken up sleep from the pain.  No associated nausea/vomiting.  Reports low-grade fever at home.  Seen in urgent care and sent here.  On initial ED arrival, is a low-grade temp of 99.7 and is tachycardic.  She appears uncomfortable but no acute distress.  On exam, she has some mild abdominal distention noted lower abdomen with diffuse tenderness.  Chest with some left-sided CVA tenderness.  Concern for intra-abdominal infectious process versus GU etiology such as kidney stone versus UTI.  Plan to check labs.  Will obtain CT and pelvis for concern of intra-abdominal pathology.  Lipase is normal.  Lactic acid is normal.  CBC shows leukocytosis of 24.6.  Hemoglobin is 7.8.  Unfortunately, we do not have any records of her previous hemoglobin so do not know if this is chronic or new.  She does not have any vaginal bleeding or rectal bleeding.  She states she has been told she has anemia previously because of heavy periods.  CMP shows bicarb of 20, BUN and creatinine within normal  limits.  UA shows hemoglobin.  No evidence of infection.  Portable abdomen pelvis shows no evidence of perforation.  There is questionable 4 mm left  kidney stone.  UA does show some mild hemoglobin but is not infectious.  Given concerns of possible infection, will plan with contrast.  CT scan shows endings concerning for right-sided tubo-ovarian abscess or PID.  She has several likely connected fluid collection surrounding her right ovary which contains a simple appearing cyst.  There is also moderate inflammatory changes noted throughout.  She has bilateral renal calculi but no obstructing kidney stones.  Pelvic exam as document above.  She did have cervical discharge noted but no CMT.  She has right adnexal tenderness.  Discussed patient with Dr. Harolyn Rutherford (OB/GYN).  Recommends starting patient on antibiotics and will plan for admission.   Portions of this note were generated with [employer]. Dictation errors may occur despite best attempts at proofreading.  Final Clinical Impressions(s) / ED Diagnoses   Final diagnoses:  Tubo-ovarian abscess  Anemia, unspecified type    ED Discharge Orders    None       Volanda Napoleon, PA-C 07/25/19 [place], MD 07/26/19 [phone]

## 2019-07-26 ENCOUNTER — Encounter (HOSPITAL_COMMUNITY): Payer: Self-pay | Admitting: General Practice

## 2019-07-26 LAB — BPAM RBC
Blood Product Expiration Date: 202012172359
Blood Product Expiration Date: 202012172359
ISSUE DATE / TIME: 202011162005
ISSUE DATE / TIME: 202011162245
Unit Type and Rh: 5100
Unit Type and Rh: 5100

## 2019-07-26 LAB — CBC WITH DIFFERENTIAL/PLATELET
Abs Immature Granulocytes: 0.09 10*3/uL — ABNORMAL HIGH (ref 0.00–0.07)
Basophils Absolute: 0.1 10*3/uL (ref 0.0–0.1)
Basophils Relative: 1 %
Eosinophils Absolute: 0.2 10*3/uL (ref 0.0–0.5)
Eosinophils Relative: 1 %
HCT: 30.6 % — ABNORMAL LOW (ref 36.0–46.0)
Hemoglobin: 8.5 g/dL — ABNORMAL LOW (ref 12.0–15.0)
Immature Granulocytes: 1 %
Lymphocytes Relative: 5 %
Lymphs Abs: 0.7 10*3/uL (ref 0.7–4.0)
MCH: 19.6 pg — ABNORMAL LOW (ref 26.0–34.0)
MCHC: 27.8 g/dL — ABNORMAL LOW (ref 30.0–36.0)
MCV: 70.7 fL — ABNORMAL LOW (ref 80.0–100.0)
Monocytes Absolute: 1.2 10*3/uL — ABNORMAL HIGH (ref 0.1–1.0)
Monocytes Relative: 9 %
Neutro Abs: 11.8 10*3/uL — ABNORMAL HIGH (ref 1.7–7.7)
Neutrophils Relative %: 83 %
Platelets: 268 10*3/uL (ref 150–400)
RBC: 4.33 MIL/uL (ref 3.87–5.11)
RDW: 18.9 % — ABNORMAL HIGH (ref 11.5–15.5)
WBC: 14 10*3/uL — ABNORMAL HIGH (ref 4.0–10.5)
nRBC: 0 % (ref 0.0–0.2)

## 2019-07-26 LAB — COMPREHENSIVE METABOLIC PANEL
ALT: 28 U/L (ref 0–44)
AST: 28 U/L (ref 15–41)
Albumin: 3.2 g/dL — ABNORMAL LOW (ref 3.5–5.0)
Alkaline Phosphatase: 76 U/L (ref 38–126)
Anion gap: 12 (ref 5–15)
BUN: 11 mg/dL (ref 6–20)
CO2: 18 mmol/L — ABNORMAL LOW (ref 22–32)
Calcium: 9 mg/dL (ref 8.9–10.3)
Chloride: 105 mmol/L (ref 98–111)
Creatinine, Ser: 0.49 mg/dL (ref 0.44–1.00)
GFR calc Af Amer: >60 mL/min (ref >60)
GFR calc non Af Amer: >60 mL/min (ref >60)
Glucose, Bld: 91 mg/dL (ref 70–99)
Potassium: 3.6 mmol/L (ref 3.5–5.1)
Sodium: 135 mmol/L (ref 135–145)
Total Bilirubin: 0.5 mg/dL (ref 0.3–1.2)
Total Protein: 6.1 g/dL — ABNORMAL LOW (ref 6.5–8.1)

## 2019-07-26 LAB — TYPE AND SCREEN
ABO/RH(D): O POS
Antibody Screen: NEGATIVE
Unit division: 0
Unit division: 0

## 2019-07-26 LAB — GENTAMICIN LEVEL, RANDOM: Gentamicin Rm: 1.4 ug/mL

## 2019-07-26 MED ORDER — SODIUM CHLORIDE 0.9 % IV SOLN
510.0000 mg | Freq: Once | INTRAVENOUS | Status: AC
  Administered 2019-07-26: 510 mg via INTRAVENOUS
  Filled 2019-07-26: qty 17

## 2019-07-26 MED ORDER — DIPHENHYDRAMINE HCL 25 MG PO CAPS
25.0000 mg | ORAL_CAPSULE | Freq: Once | ORAL | Status: AC
  Administered 2019-07-26: 25 mg via ORAL
  Filled 2019-07-26: qty 1

## 2019-07-26 NOTE — Progress Notes (Signed)
[facility] Attending Progress Note  Subjective:  Patient reported decreased pain since yesterday. No other concerning symptoms. Tolerated transfusion well.  Admitted on 07/25/2019 for TOA (tubo-ovarian abscess).    Objective:  Blood pressure 112/78, pulse 76, temperature 98.3 F (36.8 C), temperature source Oral, resp. rate 18, height 5\' 9"  (1.753 m), weight 75.8 kg, last menstrual period 07/08/2019, SpO2 99 %. Gen: NAD HENT: Normocephalic, atraumatic Lungs: Normal respiratory effort Heart: Regular rate noted Abdomen: Fibroid uterus palpated, minimal RLQ pain on palpation Cervix: Deferred Ext: 2+ DTRs, no edema, no cyanosis, negative Homan's sign  Results: CBC Latest Ref Rng & Units 07/26/2019 07/25/2019  WBC 4.0 - 10.5 K/uL 14.0(H) 24.6(H)  Hemoglobin 12.0 - 15.0 g/dL 8.5(L) 7.8(L)  Hematocrit 36.0 - 46.0 % 30.6(L) 30.6(L)  Platelets 150 - 400 K/uL 268 352   Ct Abdomen Pelvis W Contrast  Result Date: 07/25/2019 CLINICAL DATA:  Acute lower abdominal pain since this morning with chills and low-grade fever. EXAM: CT ABDOMEN AND PELVIS WITH CONTRAST TECHNIQUE: Multidetector CT imaging of the abdomen and pelvis was performed using the standard protocol following bolus administration of intravenous contrast. CONTRAST:  166mL OMNIPAQUE IOHEXOL 300 MG/ML  SOLN COMPARISON:  None. FINDINGS: Lower chest: Streaky bibasilar atelectasis but no infiltrates or effusions. The heart is within normal limits in size. No pericardial effusion. The distal esophagus is grossly normal. Hepatobiliary: No focal hepatic lesions or intrahepatic biliary dilatation. The portal and hepatic veins are patent. The gallbladder is unremarkable. Slightly prominent common bile duct measuring a maximum of 9 mm in the porta hepatis. Is also 9 mm in the head of the pancreas. I do not see an obvious obstructing pancreatic head mass, common bile duct stone or ampullary lesion. Recommend correlation with liver function  studies. If these are abnormal patient may need ERCP or MRCP for further evaluation. Pancreas: No mass, inflammation or ductal dilatation. Spleen: Normal size.  No focal lesions. Adrenals/Urinary Tract: The adrenal glands are normal. There are bilateral midpole renal calculi but I do not see any obstructing ureteral calculi or bladder calculi. Duplicated collecting system on the right side is noted but the ureters join quickly proximally. Stomach/Bowel: The stomach, duodenum, small bowel and colon are grossly normal without oral contrast. No acute inflammatory changes, mass lesions or obstructive findings. The terminal ileum is normal. The appendix is normal. There is diffuse colonic diverticulosis but no definite findings for acute diverticulitis. Vascular/Lymphatic: The aorta and branch vessels are unremarkable. The major venous structures are patent. Circumaortic left renal vein is noted. There are borderline enlarged retroperitoneal lymph nodes along with some hazy interstitial changes. This is likely inflammatory/infectious adenopathy. Reproductive: Markedly enlarged fibroid uterus with a dominant fibroid involving the anterior myometrium and measuring 9.8 x 8.2 x 10 cm. It demonstrates some component of degeneration. There is moderate compression and flattening of the endometrium posteriorly. The left ovary appears normal. There is a small cyst or follicle noted. The right ovary appears to contain a simple cyst measuring 3.3 cm. However, around the right ovary is a sizable area of PID or abscess. Irregular fluid collections surrounding parametrial vessels and rim like enhancement suggesting tubo-ovarian abscess or PID. Other: Small scattered pelvic nodes but no adenopathy. The patient is a small right inguinal hernia containing fluid. Musculoskeletal: No significant bony findings. IMPRESSION: 1. CT findings highly suspicious for right-sided tubo-ovarian abscess or PID. Several likely connected fluid collection  surrounding the right ovary which contains a simple appearing cyst. There is also moderate inflammatory  interstitial changes around this area and extending up around the right ovarian vein and into the retroperitoneum. 2. Bilateral renal calculi but no obstructing ureteral calculi or findings for pyelonephritis. 3. Common bile duct dilatation of uncertain significance. Recommend correlation with liver function studies. If these are abnormal patient may need MRCP for further evaluation. No obvious gallstones or findings for acute cholecystitis. 4. Markedly enlarged fibroid uterus with a large dominant fibroid in the anterior myometrium. Electronically Signed   By: Marijo Sanes M.D.   On: 07/25/2019 16:16   US Pelvic Complete With Transvaginal  Result Date: 07/25/2019 CLINICAL DATA:  Tubo-ovarian abscess EXAM: TRANSABDOMINAL AND TRANSVAGINAL ULTRASOUND OF PELVIS TECHNIQUE: Both transabdominal and transvaginal ultrasound examinations of the pelvis were performed. Transabdominal technique was performed for global imaging of the pelvis including uterus, ovaries, adnexal regions, and pelvic cul-de-sac. It was necessary to proceed with endovaginal exam following the transabdominal exam to visualize the uterus endometrium ovaries. COMPARISON:  CT 07/25/2019 FINDINGS: Uterus Measurements: 16.8 x 9.4 x 11.8 cm = volume: 979.8 mL. Multiple myometrial masses. Anterior mid uterine fibroid measures 2.7 x 2.4 x 2.8 cm. Large central fibroid effacing the endometrial stripe measures 9.8 x 9.5 x 6.6 cm. Endometrium Thickness: 4.2 mm.  No focal abnormality visualized. Right ovary Measurements: 5.5 x 3.5 x 5.3 cm = volume: 53.4 mL. Cyst in the right ovary measuring 3.6 x 3.2 x 2.7 cm Left ovary Not seen Other findings Complex multi cystic abnormality in the right adnexa measuring at least 4.7 x 3.5 x 3.2 cm, felt to correspond to the CT abnormality. No significant free fluid IMPRESSION: 1. Enlarged uterus with multiple masses  presumably fibroids, including large central fibroid measuring 9.8 cm, effacing the endometrial stripe 2. Nonvisualized left ovary 3. 3.6 cm simple cyst right ovary. Complex multi-septated fluid collection in the right adnexa, corresponding to CT abnormality, presumably representing tubo-ovarian abscess. The extent of this finding is better visualized on the previously performed CT. Electronically Signed   By: Donavan Foil M.D.   On: 07/25/2019 19:17     Assessment & Plan:  48 y.o. F admitted for TOA/PID, also has history of menorrhagia, symptomatic anemia and fibroids. - Continue IV Clindamycin and Gentamicin for now, analgesia as needed - Received two units of pRBCs, hemoglobin is 8.5. Feraheme ordered. - Routine floor care; hope she is transferred to 6N later today. Continue close observation.   Verita Schneiders, MD, [place] for [employer], [place]

## 2019-07-26 NOTE — ED Notes (Signed)
Female visitor arrived to pt's room.

## 2019-07-26 NOTE — ED Notes (Addendum)
Eating lunch. Attempted to call report.

## 2019-07-26 NOTE — ED Notes (Signed)
Pt ate breakfast - ambulated to bathroom and back to room w/o difficulty.

## 2019-07-26 NOTE — Progress Notes (Signed)
Pharmacy Note  Gentamicin level 1.4 mcg/ml, drawn 9.5hr after dose; appropriate for extended-interval dosing.  Wynona Neat, PharmD, BCPS 07/26/2019 6:18 AM

## 2019-07-26 NOTE — ED Notes (Signed)
Diet was ordered for Lunch. 

## 2019-07-26 NOTE — ED Notes (Signed)
Admitting MD in w pt

## 2019-07-26 NOTE — ED Notes (Signed)
Pt sitting on bed talking w/spouse at bedside.

## 2019-07-26 NOTE — ED Notes (Signed)
Pt being escorted to 6N09 via stretcher.

## 2019-07-27 ENCOUNTER — Other Ambulatory Visit: Payer: Self-pay | Admitting: Obstetrics & Gynecology

## 2019-07-27 DIAGNOSIS — D649 Anemia, unspecified: Secondary | ICD-10-CM

## 2019-07-27 LAB — CBC WITH DIFFERENTIAL/PLATELET
Abs Immature Granulocytes: 0.11 10*3/uL — ABNORMAL HIGH (ref 0.00–0.07)
Basophils Absolute: 0.1 10*3/uL (ref 0.0–0.1)
Basophils Relative: 1 %
Eosinophils Absolute: 0.4 10*3/uL (ref 0.0–0.5)
Eosinophils Relative: 4 %
HCT: 29.1 % — ABNORMAL LOW (ref 36.0–46.0)
Hemoglobin: 8 g/dL — ABNORMAL LOW (ref 12.0–15.0)
Immature Granulocytes: 1 %
Lymphocytes Relative: 12 %
Lymphs Abs: 1.2 10*3/uL (ref 0.7–4.0)
MCH: 19.6 pg — ABNORMAL LOW (ref 26.0–34.0)
MCHC: 27.5 g/dL — ABNORMAL LOW (ref 30.0–36.0)
MCV: 71.3 fL — ABNORMAL LOW (ref 80.0–100.0)
Monocytes Absolute: 1.2 10*3/uL — ABNORMAL HIGH (ref 0.1–1.0)
Monocytes Relative: 12 %
Neutro Abs: 7.3 10*3/uL (ref 1.7–7.7)
Neutrophils Relative %: 70 %
Platelets: 271 10*3/uL (ref 150–400)
RBC: 4.08 MIL/uL (ref 3.87–5.11)
RDW: 19.1 % — ABNORMAL HIGH (ref 11.5–15.5)
WBC: 10.3 10*3/uL (ref 4.0–10.5)
nRBC: 0.4 % — ABNORMAL HIGH (ref 0.0–0.2)

## 2019-07-27 LAB — BASIC METABOLIC PANEL
Anion gap: 8 (ref 5–15)
BUN: 13 mg/dL (ref 6–20)
CO2: 22 mmol/L (ref 22–32)
Calcium: 8.6 mg/dL — ABNORMAL LOW (ref 8.9–10.3)
Chloride: 108 mmol/L (ref 98–111)
Creatinine, Ser: 0.68 mg/dL (ref 0.44–1.00)
GFR calc Af Amer: >60 mL/min (ref >60)
GFR calc non Af Amer: >60 mL/min (ref >60)
Glucose, Bld: 97 mg/dL (ref 70–99)
Potassium: 4.1 mmol/L (ref 3.5–5.1)
Sodium: 138 mmol/L (ref 135–145)

## 2019-07-27 LAB — GC/CHLAMYDIA PROBE AMP (~~LOC~~) NOT AT ARMC
Chlamydia: NEGATIVE
Neisseria Gonorrhea: NEGATIVE

## 2019-07-27 MED ORDER — NAPROXEN 500 MG PO TABS: 500.0000 mg | ORAL_TABLET | Freq: Two times a day (BID) | ORAL | 2 refills | Status: AC

## 2019-07-27 MED ORDER — DOXYCYCLINE HYCLATE 100 MG PO TABS
100.0000 mg | ORAL_TABLET | Freq: Two times a day (BID) | ORAL | Status: DC
  Administered 2019-07-27: 100 mg via ORAL
  Filled 2019-07-27: qty 1

## 2019-07-27 MED ORDER — SENNOSIDES-DOCUSATE SODIUM 8.6-50 MG PO TABS: 2.0000 | ORAL_TABLET | Freq: Two times a day (BID) | ORAL | 2 refills | Status: DC | PRN

## 2019-07-27 MED ORDER — TRAMADOL HCL 50 MG PO TABS: 50.0000 mg | ORAL_TABLET | Freq: Four times a day (QID) | ORAL | 0 refills | Status: AC | PRN

## 2019-07-27 MED ORDER — METRONIDAZOLE 500 MG PO TABS: 500.0000 mg | ORAL_TABLET | Freq: Two times a day (BID) | ORAL | 0 refills | Status: DC

## 2019-07-27 MED ORDER — SENNOSIDES-DOCUSATE SODIUM 8.6-50 MG PO TABS: 2.0000 | ORAL_TABLET | Freq: Two times a day (BID) | ORAL | 2 refills | Status: AC | PRN

## 2019-07-27 MED ORDER — METRONIDAZOLE 500 MG PO TABS
500.0000 mg | ORAL_TABLET | Freq: Two times a day (BID) | ORAL | Status: DC
  Administered 2019-07-27: 500 mg via ORAL
  Filled 2019-07-27: qty 1

## 2019-07-27 MED ORDER — TRAMADOL HCL 50 MG PO TABS: 50.0000 mg | ORAL_TABLET | Freq: Four times a day (QID) | ORAL | 0 refills | Status: DC | PRN

## 2019-07-27 MED ORDER — DOXYCYCLINE HYCLATE 100 MG PO TABS: 100.0000 mg | ORAL_TABLET | Freq: Two times a day (BID) | ORAL | 0 refills | Status: DC

## 2019-07-27 MED ORDER — TRANEXAMIC ACID 650 MG PO TABS: 1300.0000 mg | ORAL_TABLET | Freq: Three times a day (TID) | ORAL | 2 refills | Status: DC

## 2019-07-27 MED ORDER — NAPROXEN 500 MG PO TABS: 500.0000 mg | ORAL_TABLET | Freq: Two times a day (BID) | ORAL | 2 refills | Status: DC

## 2019-07-27 NOTE — Progress Notes (Signed)
Patient will need to receive two more doses of Feraheme as outpatient.  Has appointment on 08/09/19 at [facility]. Please help arrange this for patient and call her with appointment details. She already received her first dose in hospital on 07/26/19; needs two more doses and can receive 2nd dose as early as 08/01/19 and the 3rd dose one week after that.  Thank you! Orders signed and held.  Verita Schneiders, MD, [place] for [employer], [place]

## 2019-07-27 NOTE — Discharge Instructions (Signed)
Pelvic Inflammatory Disease  Pelvic inflammatory disease (PID) is caused by an infection in some or all of the female reproductive organs. The infection can be in the uterus, ovaries, fallopian tubes, or the surrounding tissues in the pelvis. PID can cause abdominal or pelvic pain that comes on suddenly (acute pelvic pain). PID is a serious infection because it can lead to lasting (chronic) pelvic pain or the inability to have children (infertility). What are the causes? This condition is most often caused by bacteria that is spread during sexual contact. It can also be caused by a bacterial infection of the vagina (bacterial vaginosis) that is not spread by sexual contact. This condition occurs when the infection is not treated and the bacteria travel upward from the vagina or cervix into the reproductive organs. Bacteria may also be introduced into the reproductive organs following:  The birth of a baby.  A miscarriage.  An abortion.  Major pelvic surgery.  The insertion of an intrauterine device (IUD).  A sexual assault. What increases the risk? You are more likely to develop this condition if you:  Are younger than 49 years of age.  Are sexually active at a young age.  Have a history of STI (sexually transmitted infection) or PID.  Do not regularly use barrier contraception methods, such as condoms.  Have multiple sexual partners.  Have sex with someone who has symptoms of an STI.  Use a vaginal douche.  Have recently had an IUD inserted. What are the signs or symptoms? Symptoms of this condition include:  Abdominal or pelvic pain.  Fever.  Chills.  Abnormal vaginal discharge.  Abnormal uterine bleeding.  Unusual pain shortly after the end of a menstrual period.  Painful urination.  Pain with sex.  Nausea and vomiting. How is this diagnosed? This condition is diagnosed based on a pelvic exam and medical history. A pelvic exam can reveal signs of  infection, inflammation, and discharge in the vagina and the surrounding tissues. It can also help to identify painful areas. You may also have tests, such as:  Lab tests, including a pregnancy test, blood tests, and a urine test.  Culture tests of the vagina and cervix to check for an STI.  Ultrasound.  A laparoscopic procedure to look inside the pelvis.  Examination of vaginal discharge under a microscope. How is this treated? This condition may be treated with:  Antibiotic medicines taken by mouth (orally). For more severe cases, antibiotics may be given through an IV at the hospital.  Surgery. This is rare. Surgery may be needed if other treatments do not help.  Efforts to stop the spread of the infection. Sexual partners may need to be treated if the infection is caused by an STI. It may take weeks until you are completely well. If you are diagnosed with PID, you should also be checked for HIV (human immunodeficiency virus). Your health care provider may test you for infection again 3 months after treatment. You should not have unprotected sex. Follow these instructions at home:  Take over-the-counter and prescription medicines only as told by your health care provider.  If you were prescribed an antibiotic medicine, take it as told by your health care provider. Do not stop using the antibiotic even if you start to feel better.  Do not have sex until treatment is completed or as told by your health care provider. If PID is confirmed, your recent sexual partners will need treatment, especially if you had unprotected sex.  Keep all  follow-up visits as told by your health care provider. This is important. Contact a health care provider if:  You have increased or abnormal vaginal discharge.  Your pain does not improve.  You vomit.  You have a fever.  You cannot tolerate your medicines.  Your partner has an STI.  You have pain when you urinate. Get help right away  if:  You have increased abdominal or pelvic pain.  You have chills.  Your symptoms are not better in 72 hours with treatment. Summary  Pelvic inflammatory disease (PID) is caused by an infection in some or all of the female reproductive organs.  PID is a serious infection because it can lead to lasting (chronic) pelvic pain or the inability to have children (infertility).  This infection is usually treated with antibiotic medicines.  Do not have sex until treatment is completed or as told by your health care provider. This information is not intended to replace advice given to you by your health care provider. Make sure you discuss any questions you have with your health care provider. Document Released: 08/25/2005 Document Revised: 05/13/2018 Document Reviewed: 05/18/2018 Elsevier Patient Education  [employer].   Anemia  Anemia is a condition in which you do not have enough red blood cells or hemoglobin. Hemoglobin is a substance in red blood cells that carries oxygen. When you do not have enough red blood cells or hemoglobin (are anemic), your body cannot get enough oxygen and your organs may not work properly. As a result, you may feel very tired or have other problems. What are the causes? Common causes of anemia include:  Excessive bleeding. Anemia can be caused by excessive bleeding inside or outside the body, including bleeding from the intestine or from periods in women.  Poor nutrition.  Long-lasting (chronic) kidney, thyroid, and liver disease.  Bone marrow disorders.  Cancer and treatments for cancer.  HIV (human immunodeficiency virus) and AIDS (acquired immunodeficiency syndrome).  Treatments for HIV and AIDS.  Spleen problems.  Blood disorders.  Infections, medicines, and autoimmune disorders that destroy red blood cells. What are the signs or symptoms? Symptoms of this condition include:  Minor weakness.  Dizziness.  Headache.  Feeling  heartbeats that are irregular or faster than normal (palpitations).  Shortness of breath, especially with exercise.  Paleness.  Cold sensitivity.  Indigestion.  Nausea.  Difficulty sleeping.  Difficulty concentrating. Symptoms may occur suddenly or develop slowly. If your anemia is mild, you may not have symptoms. How is this diagnosed? This condition is diagnosed based on:  Blood tests.  Your medical history.  A physical exam.  Bone marrow biopsy. Your health care provider may also check your stool (feces) for blood and may do additional testing to look for the cause of your bleeding. You may also have other tests, including:  Imaging tests, such as a CT scan or MRI.  Endoscopy.  Colonoscopy. How is this treated? Treatment for this condition depends on the cause. If you continue to lose a lot of blood, you may need to be treated at a hospital. Treatment may include:  Taking supplements of iron, vitamin K27, or folic acid.  Taking a hormone medicine (erythropoietin) that can help to stimulate red blood cell growth.  Having a blood transfusion. This may be needed if you lose a lot of blood.  Making changes to your diet.  Having surgery to remove your spleen. Follow these instructions at home:  Take over-the-counter and prescription medicines only as told by  your health care provider.  Take supplements only as told by your health care provider.  Follow any diet instructions that you were given.  Keep all follow-up visits as told by your health care provider. This is important. Contact a health care provider if:  You develop new bleeding anywhere in the body. Get help right away if:  You are very weak.  You are short of breath.  You have pain in your abdomen or chest.  You are dizzy or feel faint.  You have trouble concentrating.  You have bloody or black, tarry stools.  You vomit repeatedly or you vomit up blood. Summary  Anemia is a condition  in which you do not have enough red blood cells or enough of a substance in your red blood cells that carries oxygen (hemoglobin).  Symptoms may occur suddenly or develop slowly.  If your anemia is mild, you may not have symptoms.  This condition is diagnosed with blood tests as well as a medical history and physical exam. Other tests may be needed.  Treatment for this condition depends on the cause of the anemia. This information is not intended to replace advice given to you by your health care provider. Make sure you discuss any questions you have with your health care provider. Document Released: 10/02/2004 Document Revised: 08/07/2017 Document Reviewed: 09/26/2016 Elsevier Patient Education  2020 [employer].  Uterine Fibroids  Uterine fibroids (leiomyomas) are noncancerous (benign) tumors that can develop in the uterus. Fibroids may also develop in the fallopian tubes, cervix, or tissues (ligaments) near the uterus. You may have one or many fibroids. Fibroids vary in size, weight, and where they grow in the uterus. Some can become quite large. Most fibroids do not require medical treatment. What are the causes? The cause of this condition is not known. What increases the risk? You are more likely to develop this condition if you:  Are in your 30s or 40s and have not gone through menopause.  Have a family history of this condition.  Are of African-American descent.  Had your first period at an early age (early menarche).  Have not had any children (nulliparity).  Are overweight or obese. What are the signs or symptoms? Many women do not have any symptoms. Symptoms of this condition may include:  Heavy menstrual bleeding.  Bleeding or spotting between periods.  Pain and pressure in the pelvic area, between the hips.  Bladder problems, such as needing to urinate urgently or more often than usual.  Inability to have children (infertility).  Failure to carry pregnancy  to term (miscarriage). How is this diagnosed? This condition may be diagnosed based on:  Your symptoms and medical history.  A physical exam.  A pelvic exam that includes feeling for any tumors.  Imaging tests, such as ultrasound or MRI. How is this treated? Treatment for this condition may include:  Seeing your health care provider for follow-up visits to monitor your fibroids for any changes.  Taking NSAIDs such as ibuprofen, naproxen, or aspirin to reduce pain.  Hormone medicines. These may be taken as a pill, given in an injection, or delivered by a T-shaped device that is inserted into the uterus (intrauterine device, IUD).  Surgery to remove one of the following: ? The fibroids (myomectomy). Your health care provider may recommend this if fibroids affect your fertility and you want to become pregnant. ? The uterus (hysterectomy). ? Blood supply to the fibroids (uterine artery embolization). Follow these instructions at home:  Take  over-the-counter and prescription medicines only as told by your health care provider.  Ask your health care provider if you should take iron pills or eat more iron-rich foods, such as dark green, leafy vegetables. Heavy menstrual bleeding can cause low iron levels.  If directed, apply heat to your back or abdomen to reduce pain. Use the heat source that your health care provider recommends, such as a moist heat pack or a heating pad. ? Place a towel between your skin and the heat source. ? Leave the heat on for 20-30 minutes. ? Remove the heat if your skin turns bright red. This is especially important if you are unable to feel pain, heat, or cold. You may have a greater risk of getting burned.  Pay close attention to your menstrual cycle. Tell your health care provider about any changes, such as: ? Increased blood flow that requires you to use more pads or tampons than usual. ? A change in the number of days that your period lasts. ? A change  in symptoms that are associated with your period, such as back pain or cramps in your abdomen.  Keep all follow-up visits as told by your health care provider. This is important, especially if your fibroids need to be monitored for any changes. Contact a health care provider if you:  Have pelvic pain, back pain, or cramps in your abdomen that do not get better with medicine or heat.  Develop new bleeding between periods.  Have increased bleeding during or between periods.  Feel unusually tired or weak.  Feel light-headed. Get help right away if you:  Faint.  Have pelvic pain that suddenly gets worse.  Have severe vaginal bleeding that soaks a tampon or pad in 30 minutes or less. Summary  Uterine fibroids are noncancerous (benign) tumors that can develop in the uterus.  The exact cause of this condition is not known.  Most fibroids do not require medical treatment unless they affect your ability to have children (fertility).  Contact a health care provider if you have pelvic pain, back pain, or cramps in your abdomen that do not get better with medicines.  Make sure you know what symptoms should cause you to get help right away. This information is not intended to replace advice given to you by your health care provider. Make sure you discuss any questions you have with your health care provider. Document Released: 08/22/2000 Document Revised: 08/07/2017 Document Reviewed: 07/21/2017 Elsevier Patient Education  2020 [employer].   Menorrhagia  Menorrhagia is a condition in which menstrual periods are heavy or last longer than normal. With menorrhagia, most periods a woman has may cause enough blood loss and cramping that she becomes unable to take part in her usual activities. What are the causes? Common causes of this condition include:  Noncancerous growths in the uterus (polyps or fibroids).  An imbalance of the estrogen and progesterone hormones.  One of the  ovaries not releasing an egg during one or more months.  A problem with the thyroid gland (hypothyroid).  Side effects of having an intrauterine device (IUD).  Side effects of some medicines, such as anti-inflammatory medicines or blood thinners.  A bleeding disorder that stops the blood from clotting normally. In some cases, the cause of this condition is not known. What are the signs or symptoms? Symptoms of this condition include:  Routinely having to change your pad or tampon every 1-2 hours because it is completely soaked.  Needing to use pads  and tampons at the same time because of heavy bleeding.  Needing to wake up to change your pads or tampons during the night.  Passing blood clots larger than 1 inch (2.5 cm) in size.  Having bleeding that lasts for more than 7 days.  Having symptoms of low iron levels (anemia), such as tiredness, fatigue, or shortness of breath. How is this diagnosed? This condition may be diagnosed based on:  A physical exam.  Your symptoms and menstrual history.  Tests, such as: ? Blood tests to check if you are pregnant or have hormonal changes, a bleeding or thyroid disorder, anemia, or other problems. ? Pap test to check for cancerous changes, infections, or inflammation. ? Endometrial biopsy. This test involves removing a tissue sample from the lining of the uterus (endometrium) to be examined under a microscope. ? Pelvic ultrasound. This test uses sound waves to create images of your uterus, ovaries, and vagina. The images can show if you have fibroids or other growths. ? Hysteroscopy. For this test, a small telescope is used to look inside your uterus. How is this treated? Treatment may not be needed for this condition. If it is needed, the best treatment for you will depend on:  Whether you need to prevent pregnancy.  Your desire to have children in the future.  The cause and severity of your bleeding.  Your personal  preference. Medicines are the first step in treatment. You may be treated with:  Hormonal birth control methods. These treatments reduce bleeding during your menstrual period. They include: ? Birth control pills. ? Skin patch. ? Vaginal ring. ? Shots (injections) that you get every 3 months. ? Hormonal IUD (intrauterine device). ? Implants that go under the skin.  Medicines that thicken blood and slow bleeding.  Medicines that reduce swelling, such as ibuprofen.  Medicines that contain an artificial (synthetic) hormone called progestin.  Medicines that make the ovaries stop working for a short time.  Iron supplements to treat anemia. If medicines do not work, surgery may be done. Surgical options may include:  Dilation and curettage (D&C). In this procedure, your health care provider opens (dilates) your cervix and then scrapes or suctions tissue from the endometrium to reduce menstrual bleeding.  Operative hysteroscopy. In this procedure, a small tube with a light on the end (hysteroscope) is used to view your uterus and help remove polyps that may be causing heavy periods.  Endometrial ablation. This is when various techniques are used to permanently destroy your entire endometrium. After endometrial ablation, most women have little or no menstrual flow. This procedure reduces your ability to become pregnant.  Endometrial resection. In this procedure, an electrosurgical wire loop is used to remove the endometrium. This procedure reduces your ability to become pregnant.  Hysterectomy. This is surgical removal of the uterus. This is a permanent procedure that stops menstrual periods. Pregnancy is not possible after a hysterectomy. Follow these instructions at home: Medicines  Take over-the-counter and prescription medicines exactly as told by your health care provider. This includes iron pills.  Do not change or switch medicines without asking your health care provider.  Do not  take aspirin or medicines that contain aspirin 1 week before or during your menstrual period. Aspirin may make bleeding worse. General instructions  If you need to change your sanitary pad or tampon more than once every 2 hours, limit your activity until the bleeding stops.  Iron pills can cause constipation. To prevent or treat constipation while you are  taking prescription iron supplements, your health care provider may recommend that you: ? Drink enough fluid to keep your urine clear or pale yellow. ? Take over-the-counter or prescription medicines. ? Eat foods that are high in fiber, such as fresh fruits and vegetables, whole grains, and beans. ? Limit foods that are high in fat and processed sugars, such as fried and sweet foods.  Eat well-balanced meals, including foods that are high in iron. Foods that have a lot of iron include leafy green vegetables, meat, liver, eggs, and whole grain breads and cereals.  Do not try to lose weight until the abnormal bleeding has stopped and your blood iron level is back to normal. If you need to lose weight, work with your health care provider to lose weight safely.  Keep all follow-up visits as told by your health care provider. This is important. Contact a health care provider if:  You soak through a pad or tampon every 1 or 2 hours, and this happens every time you have a period.  You need to use pads and tampons at the same time because you are bleeding so much.  You have nausea, vomiting, diarrhea, or other problems related to medicines you are taking. Get help right away if:  You soak through more than a pad or tampon in 1 hour.  You pass clots bigger than 1 inch (2.5 cm) wide.  You feel short of breath.  You feel like your heart is beating too fast.  You feel dizzy or faint.  You feel very weak or tired. Summary  Menorrhagia is a condition in which menstrual periods are heavy or last longer than normal.  Treatment will depend on  the cause of the condition and may include medicines or procedures.  Take over-the-counter and prescription medicines exactly as told by your health care provider. This includes iron pills.  Get help right away if you have heavy bleeding that soaks through more than a pad or tampon in 1 hour, you are passing large clots, or you feel dizzy, faint or short of breath. This information is not intended to replace advice given to you by your health care provider. Make sure you discuss any questions you have with your health care provider. Document Released: 08/25/2005 Document Revised: 12/02/2017 Document Reviewed: 08/18/2016 Elsevier Patient Education  2020 [employer].

## 2019-07-27 NOTE — Discharge Summary (Signed)
Physician Discharge Summary  Patient ID: Maria Whitehead MRN: [id number] DOB/AGE: 12/09/1970 48 y.o.  Admit date: 07/25/2019 Discharge date: 07/27/2019  Admission Diagnoses:  Discharge Diagnoses:  Principal Problem:   TOA (tubo-ovarian abscess) Active Problems:   Fibroids   Menorrhagia   Symptomatic anemia  Discharged Condition: stable  Hospital Course: Patient was admitted after diagnosis with right sided TOA. Also has history of menorrhagia, symptomatic anemia and fibroids. Was admitted with pain and reported fevers at home, was afebrile during hospitalization. Treated with IV Clindamycin and Gentamicin, transitioned to oral Doxycycline and metronidazole.  No bleeding during hospitalization, but noted to have anemia with hemoglobin of 7.8. she did receive transfusion of 2 units of pRBCs followed by Feraheme, but her discharge hemoglobin was still 8.0.  Counseled about management of menorrhagia; recommended Megace vs TXA, she desired TXA. This was prescribed, Naproxen also prescribed; will also take Naproxen.  Will discuss further management in her office visit (she is considering surgery) on 08/09/19. She will be arranged to get two more doses of IV Feraheme outpatient in the meantime. Bleeding and pain precautions reviewed.  She was deemed stable for discharge to home.   Consults: None  Significant Diagnostic Studies: Recent Results (from the past 2160 hour(s))  GC/Chlamydia probe amp ([place]) not at [facility]     Status: None   Collection Time: 07/25/19 12:00 AM  Result Value Ref Range   Chlamydia Negative     Comment: Normal Reference Range - Negative   Neisseria Gonorrhea Negative     Comment: Normal Reference Range - Negative  Lipase, blood     Status: None   Collection Time: 07/25/19 10:14 AM  Result Value Ref Range   Lipase 22 11 - 51 U/L    Comment: Performed at [facility], [place] [address], [place], [place] [address]  Comprehensive metabolic panel     Status:  Abnormal   Collection Time: 07/25/19 10:14 AM  Result Value Ref Range   Sodium 134 (L) 135 - 145 mmol/L   Potassium 3.6 3.5 - 5.1 mmol/L   Chloride 101 98 - 111 mmol/L   CO2 20 (L) 22 - 32 mmol/L   Glucose, Bld 114 (H) 70 - 99 mg/dL   BUN 10 6 - 20 mg/dL   Creatinine, Ser 0.48 0.44 - 1.00 mg/dL   Calcium 9.2 8.9 - 10.3 mg/dL   Total Protein 6.9 6.5 - 8.1 g/dL   Albumin 3.7 3.5 - 5.0 g/dL   AST 40 15 - 41 U/L   ALT 39 0 - 44 U/L   Alkaline Phosphatase 82 38 - 126 U/L   Total Bilirubin 0.9 0.3 - 1.2 mg/dL   GFR calc non Af Amer >60 >60 mL/min   GFR calc Af Amer >60 >60 mL/min   Anion gap 13 5 - 15    Comment: Performed at [facility], [place] [address] [place] [address]  CBC     Status: Abnormal   Collection Time: 07/25/19 10:14 AM  Result Value Ref Range   WBC 24.6 (H) 4.0 - 10.5 K/uL   RBC 4.41 3.87 - 5.11 MIL/uL   Hemoglobin 7.8 (L) 12.0 - 15.0 g/dL    Comment: Reticulocyte Hemoglobin testing may be clinically indicated, consider ordering this additional test [id number]    HCT 30.6 (L) 36.0 - 46.0 %   MCV 69.4 (L) 80.0 - 100.0 fL   MCH 17.7 (L) 26.0 - 34.0 pg   MCHC 25.5 (L) 30.0 -  36.0 g/dL   RDW 18.3 (H) 11.5 - 15.5 %   Platelets 352 150 - 400 K/uL   nRBC 0.0 0.0 - 0.2 %    Comment: Performed at [facility], [place] [address], [place], [place] [address]  Lactic acid, plasma     Status: None   Collection Time: 07/25/19 10:14 AM  Result Value Ref Range   Lactic Acid, Venous 1.3 0.5 - 1.9 mmol/L    Comment: Performed at [facility] [address], [place], [place] [address]  I-Stat beta hCG blood, ED     Status: Abnormal   Collection Time: 07/25/19 10:24 AM  Result Value Ref Range   I-stat hCG, quantitative 82.1 (H) <5 mIU/mL   Comment 3            Comment:   GEST. AGE      CONC.  (mIU/mL)   <=1 WEEK        5 - 50     2 WEEKS       50 - 500     3 WEEKS       100 - 10,000     4 WEEKS     1,000 - 30,000        FEMALE AND NON-PREGNANT  FEMALE:     LESS THAN 5 mIU/mL   Pregnancy, urine     Status: None   Collection Time: 07/25/19 11:44 AM  Result Value Ref Range   Preg Test, Ur NEGATIVE NEGATIVE    Comment:        THE SENSITIVITY OF THIS METHODOLOGY IS >20 mIU/mL. Performed at [facility], [place] [address] [place], [place] [address]   Blood culture (routine x 2)     Status: None (Preliminary result)   Collection Time: 07/25/19 11:46 AM   Specimen: BLOOD  Result Value Ref Range   Specimen Description BLOOD RIGHT ANTECUBITAL    Special Requests      BOTTLES DRAWN AEROBIC AND ANAEROBIC Blood Culture adequate volume   Culture      NO GROWTH 2 DAYS Performed at [facility], [place] [address], [place], [place] [address]    Report Status PENDING   Blood culture (routine x 2)     Status: None (Preliminary result)   Collection Time: 07/25/19 11:51 AM   Specimen: BLOOD  Result Value Ref Range   Specimen Description BLOOD BLOOD LEFT FOREARM    Special Requests      BOTTLES DRAWN AEROBIC AND ANAEROBIC Blood Culture results may not be optimal due to an inadequate volume of blood received in culture bottles   Culture      NO GROWTH 2 DAYS Performed at [facility], [place] [address] [place], [place] [address]    Report Status PENDING   Urinalysis, Routine w reflex microscopic     Status: Abnormal   Collection Time: 07/25/19 12:59 PM  Result Value Ref Range   Color, Urine YELLOW YELLOW   APPearance HAZY (A) CLEAR   Specific Gravity, Urine 1.020 1.005 - 1.030   pH 5.0 5.0 - 8.0   Glucose, UA NEGATIVE NEGATIVE mg/dL   Hgb urine dipstick SMALL (A) NEGATIVE   Bilirubin Urine NEGATIVE NEGATIVE   Ketones, ur 80 (A) NEGATIVE mg/dL   Protein, ur 30 (A) NEGATIVE mg/dL   Nitrite NEGATIVE NEGATIVE   Leukocytes,Ua NEGATIVE NEGATIVE   RBC / HPF 0-5 0 - 5 RBC/hpf   WBC, UA 0-5 0 -  5 WBC/hpf   Bacteria, UA RARE (A) NONE SEEN   Squamous Epithelial / LPF 0-5 0 - 5   Mucus PRESENT     Comment: Performed at [facility], [place] [address] [place], [place] [address]  Lactic acid, plasma     Status: None   Collection Time: 07/25/19  1:15 PM  Result Value Ref Range   Lactic Acid, Venous 1.2 0.5 - 1.9 mmol/L    Comment: Performed at [facility] [address] [place], [place] [address]  Type and screen [place]     Status: None   Collection Time: 07/25/19  5:00 PM  Result Value Ref Range   ABO/RH(D) O POS    Antibody Screen NEG    Sample Expiration 07/28/2019,2359    Unit Number [id number]    Blood Component Type RED CELLS,LR    Unit division 00    Status of Unit ISSUED,FINAL    Transfusion Status OK TO TRANSFUSE    Crossmatch Result      Compatible Performed at [facility], [place] [address], [place], [place] [address]    Unit Number [id number]    Blood Component Type RED CELLS,LR    Unit division 00    Status of Unit ISSUED,FINAL    Transfusion Status OK TO TRANSFUSE    Crossmatch Result Compatible   ABO/Rh     Status: None   Collection Time: 07/25/19  5:00 PM  Result Value Ref Range   ABO/RH(D)      O POS Performed at [facility] [address] [place], [place] [address]   BPAM RBC     Status: None   Collection Time: 07/25/19  5:00 PM  Result Value Ref Range   ISSUE DATE / TIME [id number]    Blood Product Unit Number [id number]    PRODUCT CODE [id number]    Unit Type and Rh 5100    Blood Product Expiration Date [id number]    ISSUE DATE / TIME [id number]    Blood Product Unit Number [id number]    PRODUCT CODE [id number]    Unit Type and Rh 5100    Blood Product Expiration Date U6154733   Prepare RBC     Status: None   Collection Time: 07/25/19  5:07 PM  Result Value Ref Range   Order Confirmation      ORDER PROCESSED BY BLOOD BANK Performed at [facility], [place] [address], [place], [place] [address]   SARS CORONAVIRUS 2 (TAT 6-24 HRS) Nasopharyngeal Nasopharyngeal Swab     Status: None   Collection Time:  07/25/19  5:10 PM   Specimen: Nasopharyngeal Swab  Result Value Ref Range   SARS Coronavirus 2 NEGATIVE NEGATIVE    Comment: (NOTE) SARS-CoV-2 target nucleic acids are NOT DETECTED. The SARS-CoV-2 RNA is generally detectable in upper and lower respiratory specimens during the acute phase of infection. Negative results do not preclude SARS-CoV-2 infection, do not rule out co-infections with other pathogens, and should not be used as the sole basis for treatment or other patient management decisions. Negative results must be combined with clinical observations, patient history, and epidemiological information. The expected result is Negative. Fact Sheet for Patients: SugarRoll.be Fact Sheet for Healthcare Providers: https://www.woods-mathews.com/ This test is not yet approved or cleared by the [place] FDA and  has been authorized for detection and/or diagnosis of SARS-CoV-2 by FDA under an Emergency Use Authorization (EUA). This EUA will remain  in effect (meaning this test can be used) for the duration of the COVID-19 declaration under Section 56 4(b)(1) of the Act, 21 U.S.C. section 360bbb-3(b)(1), unless the authorization is terminated or revoked sooner. Performed at [facility], [place] [address] [place], [place] [address]   Wet prep, genital     Status: Abnormal   Collection Time: 07/25/19  5:20 PM   Specimen: Vaginal  Result Value Ref Range   Yeast Wet Prep HPF POC NONE SEEN NONE SEEN   Trich, Wet Prep NONE SEEN NONE SEEN   Clue Cells Wet Prep HPF POC NONE SEEN NONE SEEN   WBC, Wet Prep HPF POC FEW (A) NONE SEEN   Sperm NONE SEEN     Comment: Performed at [facility], [place] [address], [place], [place] [address]  CBC WITH DIFFERENTIAL     Status: Abnormal   Collection Time: 07/26/19  4:55 AM  Result Value Ref Range   WBC 14.0 (H) 4.0 - 10.5 K/uL   RBC 4.33 3.87 - 5.11 MIL/uL   Hemoglobin 8.5 (L) 12.0 - 15.0 g/dL     Comment: Reticulocyte Hemoglobin testing may be clinically indicated, consider ordering this additional test [id number]    HCT 30.6 (L) 36.0 - 46.0 %   MCV 70.7 (L) 80.0 - 100.0 fL   MCH 19.6 (L) 26.0 - 34.0 pg   MCHC 27.8 (L) 30.0 - 36.0 g/dL   RDW 18.9 (H) 11.5 - 15.5 %   Platelets 268 150 - 400 K/uL   nRBC 0.0 0.0 - 0.2 %   Neutrophils Relative % 83 %   Neutro Abs 11.8 (H) 1.7 - 7.7 K/uL   Lymphocytes Relative 5 %   Lymphs Abs 0.7 0.7 - 4.0 K/uL   Monocytes Relative 9 %   Monocytes Absolute 1.2 (H) 0.1 - 1.0 K/uL   Eosinophils Relative 1 %   Eosinophils Absolute 0.2 0.0 - 0.5 K/uL   Basophils Relative 1 %   Basophils Absolute 0.1 0.0 - 0.1 K/uL   Immature Granulocytes 1 %   Abs Immature Granulocytes 0.09 (H) 0.00 - 0.07 K/uL    Comment: Performed at [facility] [address], [place], [place] [address]  Comprehensive metabolic panel     Status: Abnormal   Collection Time: 07/26/19  4:55 AM  Result Value Ref Range   Sodium 135 135 - 145 mmol/L   Potassium 3.6 3.5 - 5.1 mmol/L   Chloride 105 98 - 111 mmol/L   CO2 18 (L) 22 - 32 mmol/L   Glucose, Bld 91 70 - 99 mg/dL   BUN 11 6 - 20 mg/dL   Creatinine, Ser 0.49 0.44 - 1.00 mg/dL   Calcium 9.0 8.9 - 10.3 mg/dL   Total Protein 6.1 (L) 6.5 - 8.1 g/dL   Albumin 3.2 (L) 3.5 - 5.0 g/dL   AST 28 15 - 41 U/L   ALT 28 0 - 44 U/L   Alkaline Phosphatase 76 38 - 126 U/L   Total Bilirubin 0.5 0.3 - 1.2 mg/dL   GFR calc non Af Amer >60 >60 mL/min   GFR calc Af Amer >60 >60 mL/min   Anion gap 12 5 - 15    Comment: Performed at [facility] [address], [place], [place] [address]  Gentamicin level, random     Status: None   Collection Time: 07/26/19  4:55 AM  Result Value Ref Range   Gentamicin Rm 1.4 ug/mL  Comment:        Random Gentamicin therapeutic range is dependent on dosage and time of specimen collection. A peak range is 5.0-10.0 ug/mL A trough range is 0.5-2.0 ug/mL        Performed at [facility] [address], [place], [place] [id number]   Basic metabolic panel     Status: Abnormal   Collection Time: 07/27/19  2:09 AM  Result Value Ref Range   Sodium 138 135 - 145 mmol/L   Potassium 4.1 3.5 - 5.1 mmol/L   Chloride 108 98 - 111 mmol/L   CO2 22 22 - 32 mmol/L   Glucose, Bld 97 70 - 99 mg/dL   BUN 13 6 - 20 mg/dL   Creatinine, Ser 0.68 0.44 - 1.00 mg/dL   Calcium 8.6 (L) 8.9 - 10.3 mg/dL   GFR calc non Af Amer >60 >60 mL/min   GFR calc Af Amer >60 >60 mL/min   Anion gap 8 5 - 15    Comment: Performed at [facility] [address], [place], [place] [address]  CBC with Differential     Status: Abnormal   Collection Time: 07/27/19  2:09 AM  Result Value Ref Range   WBC 10.3 4.0 - 10.5 K/uL   RBC 4.08 3.87 - 5.11 MIL/uL   Hemoglobin 8.0 (L) 12.0 - 15.0 g/dL    Comment: Reticulocyte Hemoglobin testing may be clinically indicated, consider ordering this additional test [id number]    HCT 29.1 (L) 36.0 - 46.0 %   MCV 71.3 (L) 80.0 - 100.0 fL   MCH 19.6 (L) 26.0 - 34.0 pg   MCHC 27.5 (L) 30.0 - 36.0 g/dL   RDW 19.1 (H) 11.5 - 15.5 %   Platelets 271 150 - 400 K/uL   nRBC 0.4 (H) 0.0 - 0.2 %   Neutrophils Relative % 70 %   Neutro Abs 7.3 1.7 - 7.7 K/uL   Lymphocytes Relative 12 %   Lymphs Abs 1.2 0.7 - 4.0 K/uL   Monocytes Relative 12 %   Monocytes Absolute 1.2 (H) 0.1 - 1.0 K/uL   Eosinophils Relative 4 %   Eosinophils Absolute 0.4 0.0 - 0.5 K/uL   Basophils Relative 1 %   Basophils Absolute 0.1 0.0 - 0.1 K/uL   Immature Granulocytes 1 %   Abs Immature Granulocytes 0.11 (H) 0.00 - 0.07 K/uL    Comment: Performed at [facility], [address] [place], [place] [address]   Ct Abdomen Pelvis W Contrast  Result Date: 07/25/2019 CLINICAL DATA:  Acute lower abdominal pain since this morning with chills and low-grade fever. EXAM: CT ABDOMEN AND PELVIS WITH CONTRAST TECHNIQUE: Multidetector CT imaging of the abdomen and pelvis was performed using  the standard protocol following bolus administration of intravenous contrast. CONTRAST:  186mL OMNIPAQUE IOHEXOL 300 MG/ML  SOLN COMPARISON:  None. FINDINGS: Lower chest: Streaky bibasilar atelectasis but no infiltrates or effusions. The heart is within normal limits in size. No pericardial effusion. The distal esophagus is grossly normal. Hepatobiliary: No focal hepatic lesions or intrahepatic biliary dilatation. The portal and hepatic veins are patent. The gallbladder is unremarkable. Slightly prominent common bile duct measuring a maximum of 9 mm in the porta hepatis. Is also 9 mm in the head of the pancreas. I do not see an obvious obstructing pancreatic head mass, common bile duct stone or ampullary lesion. Recommend correlation with liver function studies. If these are abnormal patient may need ERCP or  MRCP for further evaluation. Pancreas: No mass, inflammation or ductal dilatation. Spleen: Normal size.  No focal lesions. Adrenals/Urinary Tract: The adrenal glands are normal. There are bilateral midpole renal calculi but I do not see any obstructing ureteral calculi or bladder calculi. Duplicated collecting system on the right side is noted but the ureters join quickly proximally. Stomach/Bowel: The stomach, duodenum, small bowel and colon are grossly normal without oral contrast. No acute inflammatory changes, mass lesions or obstructive findings. The terminal ileum is normal. The appendix is normal. There is diffuse colonic diverticulosis but no definite findings for acute diverticulitis. Vascular/Lymphatic: The aorta and branch vessels are unremarkable. The major venous structures are patent. Circumaortic left renal vein is noted. There are borderline enlarged retroperitoneal lymph nodes along with some hazy interstitial changes. This is likely inflammatory/infectious adenopathy. Reproductive: Markedly enlarged fibroid uterus with a dominant fibroid involving the anterior myometrium and measuring 9.8 x 8.2  x 10 cm. It demonstrates some component of degeneration. There is moderate compression and flattening of the endometrium posteriorly. The left ovary appears normal. There is a small cyst or follicle noted. The right ovary appears to contain a simple cyst measuring 3.3 cm. However, around the right ovary is a sizable area of PID or abscess. Irregular fluid collections surrounding parametrial vessels and rim like enhancement suggesting tubo-ovarian abscess or PID. Other: Small scattered pelvic nodes but no adenopathy. The patient is a small right inguinal hernia containing fluid. Musculoskeletal: No significant bony findings. IMPRESSION: 1. CT findings highly suspicious for right-sided tubo-ovarian abscess or PID. Several likely connected fluid collection surrounding the right ovary which contains a simple appearing cyst. There is also moderate inflammatory interstitial changes around this area and extending up around the right ovarian vein and into the retroperitoneum. 2. Bilateral renal calculi but no obstructing ureteral calculi or findings for pyelonephritis. 3. Common bile duct dilatation of uncertain significance. Recommend correlation with liver function studies. If these are abnormal patient may need MRCP for further evaluation. No obvious gallstones or findings for acute cholecystitis. 4. Markedly enlarged fibroid uterus with a large dominant fibroid in the anterior myometrium. Electronically Signed   By: Marijo Sanes M.D.   On: 07/25/2019 16:16   Dg Abd Portable 1 View  Result Date: 07/25/2019 CLINICAL DATA:  Abdominal pain EXAM: PORTABLE ABDOMEN - 1 VIEW COMPARISON:  None. FINDINGS: Bowel gas pattern is unremarkable. There is a 4 mm radiopaque density overlying the left abdomen, which could reflect a renal calculus. Osseous structures are unremarkable. IMPRESSION: Normal bowel gas pattern.  Possible 4 mm left renal stone. Electronically Signed   By: Macy Mis M.D.   On: 07/25/2019 12:45   US  Pelvic Complete With Transvaginal  Result Date: 07/25/2019 CLINICAL DATA:  Tubo-ovarian abscess EXAM: TRANSABDOMINAL AND TRANSVAGINAL ULTRASOUND OF PELVIS TECHNIQUE: Both transabdominal and transvaginal ultrasound examinations of the pelvis were performed. Transabdominal technique was performed for global imaging of the pelvis including uterus, ovaries, adnexal regions, and pelvic cul-de-sac. It was necessary to proceed with endovaginal exam following the transabdominal exam to visualize the uterus endometrium ovaries. COMPARISON:  CT 07/25/2019 FINDINGS: Uterus Measurements: 16.8 x 9.4 x 11.8 cm = volume: 979.8 mL. Multiple myometrial masses. Anterior mid uterine fibroid measures 2.7 x 2.4 x 2.8 cm. Large central fibroid effacing the endometrial stripe measures 9.8 x 9.5 x 6.6 cm. Endometrium Thickness: 4.2 mm.  No focal abnormality visualized. Right ovary Measurements: 5.5 x 3.5 x 5.3 cm = volume: 53.4 mL. Cyst in the right ovary measuring  3.6 x 3.2 x 2.7 cm Left ovary Not seen Other findings Complex multi cystic abnormality in the right adnexa measuring at least 4.7 x 3.5 x 3.2 cm, felt to correspond to the CT abnormality. No significant free fluid IMPRESSION: 1. Enlarged uterus with multiple masses presumably fibroids, including large central fibroid measuring 9.8 cm, effacing the endometrial stripe 2. Nonvisualized left ovary 3. 3.6 cm simple cyst right ovary. Complex multi-septated fluid collection in the right adnexa, corresponding to CT abnormality, presumably representing tubo-ovarian abscess. The extent of this finding is better visualized on the previously performed CT. Electronically Signed   By: Donavan Foil M.D.   On: 07/25/2019 19:17   Discharge Exam: Blood pressure 119/76, pulse 84, temperature 98.3 F (36.8 C), temperature source Oral, resp. rate 17, height 5\' 9"  (1.753 m), weight 79.9 kg, last menstrual period 07/08/2019, SpO2 99 %. Gen: NAD HENT: Normocephalic, atraumatic Lungs: Normal  respiratory effort Heart: Regular rate noted Abdomen: 20 week sized fibroid uterus palpated, minimal RLQ pain on palpation Cervix: Deferred Ext: 2+ DTRs, no edema, no cyanosis, negative Homan's   Disposition: Discharge disposition: 01-Home or Self Care        Allergies as of 07/27/2019      Reactions   Penicillins Anaphylaxis   Did it involve swelling of the face/tongue/throat, SOB, or low BP? Yes Did it involve sudden or severe rash/hives, skin peeling, or any reaction on the inside of your mouth or nose? No Did you need to seek medical attention at a hospital or doctor's office? Yes When did it last happen?Pt was a child  If all above answers are "NO", may proceed with cephalosporin use.      Medication List    STOP taking these medications   ibuprofen 200 MG tablet Commonly known as: ADVIL     TAKE these medications   acetaminophen 500 MG tablet Commonly known as: TYLENOL Take 1,000 mg by mouth every 6 (six) hours as needed for mild pain.   doxycycline 100 MG tablet Commonly known as: VIBRA-TABS Take 1 tablet (100 mg total) by mouth every 12 (twelve) hours for 14 days.   metroNIDAZOLE 500 MG tablet Commonly known as: FLAGYL Take 1 tablet (500 mg total) by mouth every 12 (twelve) hours for 14 days.   multivitamin with minerals Tabs tablet Take 1 tablet by mouth daily.   naproxen 500 MG tablet Commonly known as: NAPROSYN Take 1 tablet (500 mg total) by mouth 2 (two) times daily with a meal. As needed for pain and during menstrual periods   senna-docusate 8.6-50 MG tablet Commonly known as: Senokot-S Take 2 tablets by mouth 2 (two) times daily as needed for mild constipation or moderate constipation.   traMADol 50 MG tablet Commonly known as: ULTRAM Take 1-2 tablets (50-100 mg total) by mouth every 6 (six) hours as needed for severe pain.   tranexamic acid 650 MG Tabs tablet Commonly known as: LYSTEDA Take 2 tablets (1,300 mg total) by mouth 3  (three) times daily. Take during menses for a maximum of five days        Signed: Verita Schneiders, MD 07/27/2019, 12:44 PM

## 2019-07-27 NOTE — Progress Notes (Signed)
Patient discharged to home. Verbalizes understanding of all discharge instructions including discharge medications and follow up MD visits. Patient accompanied by spouse.

## 2019-07-29 ENCOUNTER — Telehealth: Payer: Self-pay

## 2019-07-29 NOTE — Telephone Encounter (Signed)
Called pt to advise of [facility]

## 2019-07-29 NOTE — Progress Notes (Signed)
Called Short Stay to schedule [facility] for pt, soonest they had was on 08/08/19 at 9am.

## 2019-07-29 NOTE — Progress Notes (Signed)
That is fine. She can go on 08/08/19. Thank you!

## 2019-07-30 LAB — CULTURE, BLOOD (ROUTINE X 2)
Culture: NO GROWTH
Culture: NO GROWTH
Special Requests: ADEQUATE

## 2019-08-08 ENCOUNTER — Ambulatory Visit (HOSPITAL_COMMUNITY)
Admission: RE | Admit: 2019-08-08 | Discharge: 2019-08-08 | Disposition: A | Discharge status: Home / Self Care | Payer: BC Managed Care – PPO | Source: Ambulatory Visit | Attending: Obstetrics & Gynecology | Admitting: Obstetrics & Gynecology

## 2019-08-08 ENCOUNTER — Other Ambulatory Visit: Payer: Self-pay

## 2019-08-08 DIAGNOSIS — D649 Anemia, unspecified: Secondary | ICD-10-CM | POA: Diagnosis not present

## 2019-08-08 MED ORDER — SODIUM CHLORIDE 0.9 % IV SOLN
510.0000 mg | INTRAVENOUS | Status: DC
  Administered 2019-08-08: 510 mg via INTRAVENOUS
  Filled 2019-08-08: qty 510

## 2019-08-08 NOTE — Discharge Instructions (Signed)

## 2019-08-10 ENCOUNTER — Encounter: Payer: Self-pay | Admitting: Obstetrics and Gynecology

## 2019-08-10 ENCOUNTER — Ambulatory Visit (INDEPENDENT_AMBULATORY_CARE_PROVIDER_SITE_OTHER): Payer: BC Managed Care – PPO | Admitting: Obstetrics and Gynecology

## 2019-08-10 ENCOUNTER — Other Ambulatory Visit: Payer: Self-pay

## 2019-08-10 VITALS — BP 134/79 | HR 73 | Wt 166.5 lb

## 2019-08-10 DIAGNOSIS — Z1231 Encounter for screening mammogram for malignant neoplasm of breast: Secondary | ICD-10-CM | POA: Diagnosis not present

## 2019-08-10 DIAGNOSIS — Z1151 Encounter for screening for human papillomavirus (HPV): Secondary | ICD-10-CM | POA: Diagnosis not present

## 2019-08-10 DIAGNOSIS — N7093 Salpingitis and oophoritis, unspecified: Secondary | ICD-10-CM

## 2019-08-10 DIAGNOSIS — D219 Benign neoplasm of connective and other soft tissue, unspecified: Secondary | ICD-10-CM | POA: Diagnosis not present

## 2019-08-10 DIAGNOSIS — Z01419 Encounter for gynecological examination (general) (routine) without abnormal findings: Secondary | ICD-10-CM | POA: Diagnosis not present

## 2019-08-10 DIAGNOSIS — Z124 Encounter for screening for malignant neoplasm of cervix: Secondary | ICD-10-CM | POA: Diagnosis not present

## 2019-08-10 DIAGNOSIS — N200 Calculus of kidney: Secondary | ICD-10-CM | POA: Insufficient documentation

## 2019-08-10 DIAGNOSIS — K4090 Unilateral inguinal hernia, without obstruction or gangrene, not specified as recurrent: Secondary | ICD-10-CM | POA: Insufficient documentation

## 2019-08-10 NOTE — Patient Instructions (Signed)
Inguinal Hernia, Adult An inguinal hernia is when fat or your intestines push through a weak spot in a muscle where your leg meets your lower belly (groin). This causes a rounded lump (bulge). This kind of hernia could also be:  In your scrotum, if you are female.  In folds of skin around your vagina, if you are female. There are three types of inguinal hernias. These include:  Hernias that can be pushed back into the belly (are reducible). This type rarely causes pain.  Hernias that cannot be pushed back into the belly (are incarcerated).  Hernias that cannot be pushed back into the belly and lose their blood supply (are strangulated). This type needs emergency surgery. If you do not have symptoms, you may not need treatment. If you have symptoms or a large hernia, you may need surgery. Follow these instructions at home: Lifestyle  Do these things if told by your doctor so you do not have trouble pooping (constipation): ? Drink enough fluid to keep your pee (urine) pale yellow. ? Eat foods that have a lot of fiber. These include fresh fruits and vegetables, whole grains, and beans. ? Limit foods that are high in fat and processed sugars. These include foods that are fried or sweet. ? Take medicine for trouble pooping.  Avoid lifting heavy objects.  Avoid standing for long amounts of time.  Do not use any products that contain nicotine or tobacco. These include cigarettes and e-cigarettes. If you need help quitting, ask your doctor.  Stay at a healthy weight. General instructions  You may try to push your hernia in by very gently pressing on it when you are lying down. Do not try to force the bulge back in if it will not push in easily.  Watch your hernia for any changes in shape, size, or color. Tell your doctor if you see any changes.  Take over-the-counter and prescription medicines only as told by your doctor.  Keep all follow-up visits as told by your doctor. This is  important. Contact a doctor if:  You have a fever.  You have new symptoms.  Your symptoms get worse. Get help right away if:  The area where your leg meets your lower belly has: ? Pain that gets worse suddenly. ? A bulge that gets bigger suddenly, and it does not get smaller after that. ? A bulge that turns red or purple. ? A bulge that is painful when you touch it.  You are a man, and your scrotum: ? Suddenly feels painful. ? Suddenly changes in size.  You cannot push the hernia in by very gently pressing on it when you are lying down. Do not try to force the bulge back in if it will not push in easily.  You feel sick to your stomach (nauseous), and that feeling does not go away.  You throw up (vomit), and that keeps happening.  You have a fast heartbeat.  You cannot poop (have a bowel movement) or pass gas. These symptoms may be an emergency. Do not wait to see if the symptoms will go away. Get medical help right away. Call your local emergency services (911 in the [place] Summary  An inguinal hernia is when fat or your intestines push through a weak spot in a muscle where your leg meets your lower belly (groin). This causes a rounded lump (bulge).  If you do not have symptoms, you may not need treatment. If you have symptoms or a large hernia, you   may need surgery.  Avoid lifting heavy objects. Also avoid standing for long amounts of time.  Do not try to force the bulge back in if it will not push in easily. This information is not intended to replace advice given to you by your health care provider. Make sure you discuss any questions you have with your health care provider. Document Released: 09/25/2006 Document Revised: 09/26/2017 Document Reviewed: 05/27/2017 Elsevier Patient Education  2020 Elsevier Inc.  

## 2019-08-10 NOTE — Progress Notes (Addendum)
Obstetrics and Gynecology Follow Up Patient Evaluation  Appointment Date: 08/10/2019  OBGYN Clinic: [facility]  Primary Care Provider: None  Chief Complaint: follow up hospitalization for right sided TOA  History of Present Illness: Maria Whitehead is a 48 y.o. Caucasian [id number] (Patient's last menstrual period was 08/03/2019 (exact date).), seen for the above chief complaint. Her past medical history is significant for nothing.  Patient came to [facility] and then sent to ED on 11/16 for abdominal pain. She was diagnosed with right sided toa and admitted for IV abx. She also had anemia; she was discharged on 11/18 (see below). She has one more day of the PO abx to complete.   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 She had a period that is just ending and it was better with the Lysteda.   Since her hospitalization she states that she notices a right bulge in the mons area when she states; no pain or other s/s.   Patient has a h/o kidney stones and pain didn't feel like that.   No breast s/s, hot flashes, night sweats  Review of Systems:  as noted in the History of Present Illness.  Patient Active Problem List   Diagnosis Date Noted  . Bilateral nephrolithiasis 08/10/2019  . Inguinal hernia, right 08/10/2019  . TOA (tubo-ovarian abscess) 07/25/2019  . Fibroids 07/25/2019  . Menorrhagia 07/25/2019  . Symptomatic anemia 07/25/2019    Past Medical History:  Past Medical History:  Diagnosis Date  . Anemia   . Inguinal hernia, right   . Nephrolithiasis   . TOA (tubo-ovarian abscess) 07/2019    Past Surgical History:  Past Surgical History:  Procedure Laterality Date  . WISDOM TOOTH EXTRACTION      Past Obstetrical History:  OB History  Gravida Para Term Preterm AB Living  1       1    SAB TAB Ectopic Multiple Live Births    1          #  Outcome Date GA Lbr Len/2nd Weight Sex Delivery Anes PTL Lv  1 TAB 1991            Past Gynecological History: As per HPI. Menarche age 19 Periods: qmonth; heavy especially the first 1-3 days and has been heavy for years; usually lasts 5 days; not really painful;  History of Pap Smear(s): Yes.   Last pap ?with TAB in early 90s, which was negative She is currently using no method for contraception.   Social History:  Social History   Socioeconomic History  . Marital status: Married    Spouse name: Not on file  . Number of children: Not on file  . Years of education: Not on file  . Highest education level: Not on file  Occupational History  . Not on file  Social Needs  . Financial resource strain: Not on file  . Food insecurity    Worry: Not on file    Inability: Not on file  . Transportation needs    Medical: Not on file    Non-medical: Not on file  Tobacco Use  . Smoking status: Never Smoker  . Smokeless tobacco: Never Used  Substance and Sexual Activity  . Alcohol use: Yes    Frequency: Never    Comment: every other day  . Drug use: Never  . Sexual activity: Not  on file  Lifestyle  . Physical activity    Days per week: Not on file    Minutes per session: Not on file  . Stress: Not on file  Relationships  . Social Herbalist on phone: Not on file    Gets together: Not on file    Attends religious service: Not on file    Active member of club or organization: Not on file    Attends meetings of clubs or organizations: Not on file    Relationship status: Not on file  . Intimate partner violence    Fear of current or ex partner: Not on file    Emotionally abused: Not on file    Physically abused: Not on file    Forced sexual activity: Not on file  Other Topics Concern  . Not on file  Social History Narrative  . Not on file    Family History:  No history of blood clots in heart, lungs  Health Maintenance:  Mammogram(s): No.    Medications Enis Slipper had no medications administered during this visit. Current Outpatient Medications  Medication Sig Dispense Refill  . acetaminophen (TYLENOL) 500 MG tablet Take 1,000 mg by mouth every 6 (six) hours as needed for mild pain.    . Multiple Vitamin (MULTIVITAMIN WITH MINERALS) TABS tablet Take 1 tablet by mouth daily.    . naproxen (NAPROSYN) 500 MG tablet Take 1 tablet (500 mg total) by mouth 2 (two) times daily with a meal. As needed for pain and during menstrual periods 60 tablet 2  . tranexamic acid (LYSTEDA) 650 MG TABS tablet Take 2 tablets (1,300 mg total) by mouth 3 (three) times daily. Take during menses for a maximum of five days 30 tablet 2   No current facility-administered medications for this visit.     Allergies Penicillins   Physical Exam:  BP 134/79   Pulse 73   Wt 166 lb 8 oz (75.5 kg)   LMP 08/03/2019 (Exact Date)   BMI 24.59 kg/m  Body mass index is 24.59 kg/m.  General appearance: Well nourished, well developed female in no acute distress.   Respiratory:  Normal respiratory effort Abdomen: positive bowel sounds. Nttp. Fundus just below the umbilicus Neuro/Psych:  Normal mood and affect.  Skin:  Warm and dry.  Lymphatic:  No inguinal lymphadenopathy.   Pelvic exam: is not limited by body habitus EGBUS: within normal limits Vagina: within normal limits and with scant old blood. No  discharge in the vault Cervix: normal appearing cervix without tenderness, discharge or lesions. No active bleeding Uterus:  enlarged, c/w 18 week size and non tender Adnexa:  normal adnexa and no mass, fullness, tenderness Rectovaginal: deferred  When she stands, there is a ? Bulge that is about 2cm in the right mons area. Doesn't change with valsalva  Laboratory: negative gc/ct/trich CBC Latest Ref Rng & Units 07/27/2019 07/26/2019 07/25/2019  WBC 4.0 - 10.5 K/uL 10.3 14.0(H) 24.6(H)  Hemoglobin 12.0 - 15.0 g/dL 8.0(L) 8.5(L) 7.8(L)  Hematocrit  36.0 - 46.0 % 29.1(L) 30.6(L) 30.6(L)  Platelets 150 - 400 K/uL 271 268 352    Radiology:  CLINICAL DATA:  Tubo-ovarian abscess  EXAM: TRANSABDOMINAL AND TRANSVAGINAL ULTRASOUND OF PELVIS  TECHNIQUE: Both transabdominal and transvaginal ultrasound examinations of the pelvis were performed. Transabdominal technique was performed for global imaging of the pelvis including uterus, ovaries, adnexal regions, and pelvic cul-de-sac. It was necessary to proceed with endovaginal exam following the transabdominal exam to  visualize the uterus endometrium ovaries.  COMPARISON:  CT 07/25/2019  FINDINGS: Uterus  Measurements: 16.8 x 9.4 x 11.8 cm = volume: 979.8 mL. Multiple myometrial masses. Anterior mid uterine fibroid measures 2.7 x 2.4 x 2.8 cm. Large central fibroid effacing the endometrial stripe measures 9.8 x 9.5 x 6.6 cm.  Endometrium  Thickness: 4.2 mm.  No focal abnormality visualized.  Right ovary  Measurements: 5.5 x 3.5 x 5.3 cm = volume: 53.4 mL. Cyst in the right ovary measuring 3.6 x 3.2 x 2.7 cm  Left ovary  Not seen  Other findings  Complex multi cystic abnormality in the right adnexa measuring at least 4.7 x 3.5 x 3.2 cm, felt to correspond to the CT abnormality. No significant free fluid  IMPRESSION: 1. Enlarged uterus with multiple masses presumably fibroids, including large central fibroid measuring 9.8 cm, effacing the endometrial stripe 2. Nonvisualized left ovary 3. 3.6 cm simple cyst right ovary. Complex multi-septated fluid collection in the right adnexa, corresponding to CT abnormality, presumably representing tubo-ovarian abscess. The extent of this finding is better visualized on the previously performed CT.   Electronically Signed   By: Donavan Foil M.D.   On: 07/25/2019 19:17  CLINICAL DATA:  Acute lower abdominal pain since this morning with chills and low-grade fever.  EXAM: CT ABDOMEN AND PELVIS WITH  CONTRAST  TECHNIQUE: Multidetector CT imaging of the abdomen and pelvis was performed using the standard protocol following bolus administration of intravenous contrast.  CONTRAST:  171mL OMNIPAQUE IOHEXOL 300 MG/ML  SOLN  COMPARISON:  None.  FINDINGS: Lower chest: Streaky bibasilar atelectasis but no infiltrates or effusions. The heart is within normal limits in size. No pericardial effusion. The distal esophagus is grossly normal.  Hepatobiliary: No focal hepatic lesions or intrahepatic biliary dilatation. The portal and hepatic veins are patent. The gallbladder is unremarkable. Slightly prominent common bile duct measuring a maximum of 9 mm in the porta hepatis. Is also 9 mm in the head of the pancreas. I do not see an obvious obstructing pancreatic head mass, common bile duct stone or ampullary lesion. Recommend correlation with liver function studies. If these are abnormal patient may need ERCP or MRCP for further evaluation.  Pancreas: No mass, inflammation or ductal dilatation.  Spleen: Normal size.  No focal lesions.  Adrenals/Urinary Tract: The adrenal glands are normal.  There are bilateral midpole renal calculi but I do not see any obstructing ureteral calculi or bladder calculi. Duplicated collecting system on the right side is noted but the ureters join quickly proximally.  Stomach/Bowel: The stomach, duodenum, small bowel and colon are grossly normal without oral contrast. No acute inflammatory changes, mass lesions or obstructive findings. The terminal ileum is normal. The appendix is normal. There is diffuse colonic diverticulosis but no definite findings for acute diverticulitis.  Vascular/Lymphatic: The aorta and branch vessels are unremarkable. The major venous structures are patent. Circumaortic left renal vein is noted.  There are borderline enlarged retroperitoneal lymph nodes along with some hazy interstitial changes. This is  likely inflammatory/infectious adenopathy.  Reproductive: Markedly enlarged fibroid uterus with a dominant fibroid involving the anterior myometrium and measuring 9.8 x 8.2 x 10 cm. It demonstrates some component of degeneration. There is moderate compression and flattening of the endometrium posteriorly.  The left ovary appears normal. There is a small cyst or follicle noted. The right ovary appears to contain a simple cyst measuring 3.3 cm. However, around the right ovary is a sizable area of PID or abscess. Irregular  fluid collections surrounding parametrial vessels and rim like enhancement suggesting tubo-ovarian abscess or PID.  Other: Small scattered pelvic nodes but no adenopathy. The patient is a small right inguinal hernia containing fluid.  Musculoskeletal: No significant bony findings.  IMPRESSION: 1. CT findings highly suspicious for right-sided tubo-ovarian abscess or PID. Several likely connected fluid collection surrounding the right ovary which contains a simple appearing cyst. There is also moderate inflammatory interstitial changes around this area and extending up around the right ovarian vein and into the retroperitoneum. 2. Bilateral renal calculi but no obstructing ureteral calculi or findings for pyelonephritis. 3. Common bile duct dilatation of uncertain significance. Recommend correlation with liver function studies. If these are abnormal patient may need MRCP for further evaluation. No obvious gallstones or findings for acute cholecystitis. 4. Markedly enlarged fibroid uterus with a large dominant fibroid in the anterior myometrium.   Electronically Signed   By: Marijo Sanes M.D.   On: 07/25/2019 16:16  Assessment: pt improving  Plan:  1. Encounter for screening mammogram for breast cancer Pt amenable to screening - MM 3D SCREEN BREAST BILATERAL; Future  2. Well woman exam Follow up screening pap. D/w her that the Marshell Levan is not  contraception and to let us know if she'd like to do anything for this.  - Cytology - PAP( [facility])  3. Fibroids D/w her re: options. I told her that if she's not symptomatic that not necessarily anything that needs to be done about fibroids. Her symptoms include menorrhagia, anemia and she does state that sometime she has some fullness at times.  I told her that it's good that the Marshell Levan is working and she can continue it indefinitely. I told her that if the Marshell Levan stops working that she has other medical options available too, including Lupron consideration given her age. I also d/w her re: surgical options but I told her I wouldn't necessarily jump to that now b/c the Lysteda seems to be working and her main symptom is the anemia. I told her that for surgery I'd recommend a hyst or talk to IR about [place]. If a hyst, given the size of her fibroids, she'd need it done abdominally.   Will follow up with her in a few months to see how things are going and to repeat her CBC. She has another feraheme injection in a few days.  4. TOA (tubo-ovarian abscess) Doing well. Will get u/s to ensure resolution - US PELVIS TRANSVAGINAL NON-OB (TV ONLY); Future  5. Inguinal hernia, right Recommend Gen Surg eval but patient would like to do exp management. D/w her when to go to ED. I also told her to let us know so can do referral if it gets bigger regardless of s/s.  Orders Placed This Encounter  Procedures  . MM 3D SCREEN BREAST BILATERAL  . US PELVIS TRANSVAGINAL NON-OB (TV ONLY)    RTC 1-2 months  Aletha Halim, Brooke Bonito MD Attending Center for [facility])

## 2019-08-12 LAB — CYTOLOGY - PAP
Comment: NEGATIVE
Diagnosis: NEGATIVE
High risk HPV: NEGATIVE

## 2019-08-15 ENCOUNTER — Other Ambulatory Visit: Payer: Self-pay

## 2019-08-15 ENCOUNTER — Ambulatory Visit (HOSPITAL_COMMUNITY)
Admission: RE | Admit: 2019-08-15 | Discharge: 2019-08-15 | Disposition: A | Discharge status: Home / Self Care | Payer: BC Managed Care – PPO | Source: Ambulatory Visit | Attending: Obstetrics & Gynecology | Admitting: Obstetrics & Gynecology

## 2019-08-15 DIAGNOSIS — D649 Anemia, unspecified: Secondary | ICD-10-CM | POA: Insufficient documentation

## 2019-08-15 MED ORDER — SODIUM CHLORIDE 0.9 % IV SOLN
510.0000 mg | INTRAVENOUS | Status: AC
  Administered 2019-08-15: 09:00:00 510 mg via INTRAVENOUS
  Filled 2019-08-15: qty 17

## 2019-08-21 ENCOUNTER — Other Ambulatory Visit: Payer: Self-pay | Admitting: Obstetrics & Gynecology

## 2019-09-11 ENCOUNTER — Other Ambulatory Visit: Payer: Self-pay | Admitting: Obstetrics & Gynecology

## 2019-09-13 ENCOUNTER — Ambulatory Visit (HOSPITAL_COMMUNITY)
Admission: RE | Admit: 2019-09-13 | Discharge: 2019-09-13 | Disposition: A | Discharge status: Home / Self Care | Payer: BC Managed Care – PPO | Source: Ambulatory Visit | Attending: Obstetrics and Gynecology | Admitting: Obstetrics and Gynecology

## 2019-09-13 ENCOUNTER — Other Ambulatory Visit: Payer: Self-pay

## 2019-09-13 DIAGNOSIS — N7093 Salpingitis and oophoritis, unspecified: Secondary | ICD-10-CM | POA: Insufficient documentation

## 2019-09-13 DIAGNOSIS — D259 Leiomyoma of uterus, unspecified: Secondary | ICD-10-CM | POA: Diagnosis not present

## 2019-09-16 ENCOUNTER — Ambulatory Visit (INDEPENDENT_AMBULATORY_CARE_PROVIDER_SITE_OTHER): Payer: BC Managed Care – PPO | Admitting: Obstetrics and Gynecology

## 2019-09-16 ENCOUNTER — Encounter: Payer: Self-pay | Admitting: Obstetrics and Gynecology

## 2019-09-16 ENCOUNTER — Other Ambulatory Visit: Payer: Self-pay

## 2019-09-16 VITALS — BP 130/70 | HR 76 | Wt 166.0 lb

## 2019-09-16 DIAGNOSIS — Z862 Personal history of diseases of the blood and blood-forming organs and certain disorders involving the immune mechanism: Secondary | ICD-10-CM

## 2019-09-16 DIAGNOSIS — D219 Benign neoplasm of connective and other soft tissue, unspecified: Secondary | ICD-10-CM | POA: Diagnosis not present

## 2019-09-16 DIAGNOSIS — D649 Anemia, unspecified: Secondary | ICD-10-CM

## 2019-09-16 LAB — CBC
Hematocrit: 39.1 % (ref 34.0–46.6)
Hemoglobin: 12.9 g/dL (ref 11.1–15.9)
MCH: 28.7 pg (ref 26.6–33.0)
MCHC: 33 g/dL (ref 31.5–35.7)
MCV: 87 fL (ref 79–97)
Platelets: 299 10*3/uL (ref 150–450)
RBC: 4.49 x10E6/uL (ref 3.77–5.28)
RDW: 23.5 % — ABNORMAL HIGH (ref 11.7–15.4)
WBC: 6.8 10*3/uL (ref 3.4–10.8)

## 2019-09-16 MED ORDER — TRANEXAMIC ACID 650 MG PO TABS: 1300.0000 mg | ORAL_TABLET | Freq: Three times a day (TID) | ORAL | 1 refills | Status: AC

## 2019-09-16 NOTE — Progress Notes (Signed)
Obstetrics and Gynecology Visit Return Patient Evaluation  Appointment Date: 09/16/2019  Primary Care Provider: Patient, No Pcp Per  OBGYN Clinic: [facility]  Chief Complaint: follow up menorrhagia and TOA  History of Present Illness:  Maria Whitehead is a 49 y.o. seen by me on 12/2 after hospitalization f/u (d/c on 11/18) due to heavy vaginal bleeding, anemia, blood transfusion and TOA.  Options d/w her and she elected for Lysteda with periods.   Interval History: Repeat u/s on 1/5 showed resolved right TOA and all else normal (see below).  She states that she is using the Lysteda with each period for first 3 days when it's heavy and it seems better but it is still somewhat heavy. No s/s of anemia, no intermenstrual bleeding still.   Review of Systems:  as noted in the History of Present Illness.  Medications:  Enis Slipper had no medications administered during this visit. Current Outpatient Medications  Medication Sig Dispense Refill  . acetaminophen (TYLENOL) 500 MG tablet Take 1,000 mg by mouth every 6 (six) hours as needed for mild pain.    . Multiple Vitamin (MULTIVITAMIN WITH MINERALS) TABS tablet Take 1 tablet by mouth daily.    . naproxen (NAPROSYN) 500 MG tablet Take 1 tablet (500 mg total) by mouth 2 (two) times daily with a meal. As needed for pain and during menstrual periods 60 tablet 2  . traMADol (ULTRAM) 50 MG tablet Take 1 tablet (50 mg total) by mouth every 6 (six) hours as needed for severe pain. 20 tablet 0  . tranexamic acid (LYSTEDA) 650 MG TABS tablet TAKE 2 TABLETS BY MOUTH 3 (THREE) TIMES DAILY. TAKE DURING MENSES FOR A MAXIMUM OF FIVE DAYS 30 tablet 5  . senna-docusate (SENOKOT-S) 8.6-50 MG tablet Take 2 tablets by mouth 2 (two) times daily as needed for mild constipation or moderate constipation. (Patient not taking: Reported on 08/10/2019) 30 tablet 2   No current facility-administered medications for this visit.    Allergies: is  allergic to penicillins.  Physical Exam:  BP 130/70   Pulse 76   Wt 166 lb (75.3 kg)   BMI 24.51 kg/m  Body mass index is 24.51 kg/m. General appearance: Well nourished, well developed female in no acute distress.  Neuro/Psych:  Normal mood and affect.     Radiology CLINICAL DATA:  Tubo-ovarian abscess  EXAM: ULTRASOUND PELVIS TRANSVAGINAL  TECHNIQUE: Transvaginal ultrasound examination of the pelvis was performed including evaluation of the uterus, ovaries, adnexal regions, and pelvic cul-de-sac.  COMPARISON:  07/25/2019  FINDINGS: Uterus  Measurements: 16.7 x 12.3 x 14.2 cm = volume: 1319 mL. Large mass at uterine fundus 9.5 x 8.3 x 8.2 cm consistent with a probable submucosal leiomyoma. Additional smaller leiomyoma identified at the lower uterine segment to LEFT 1.5 x 1.6 x 1.6 cm.  Endometrium  Obscured due to distortion of the uterus by leiomyomata.  Right ovary  Measurements: 4.8 x 3.7 x 4.6 cm = volume: 42.2 mL. Simple cyst RIGHT ovary 3.9 x 3.6 x 3.9 cm.  Left ovary  Not visualized on either transabdominal or endovaginal imaging, likely obscured by bowel  Other findings:  No free pelvic fluid.  No adnexal masses.  IMPRESSION: 2 uterine leiomyomata identified, largest at fundus 9.5 cm in greatest diameter likely extends submucosal and obscures the endometrial complex.  Simple cyst RIGHT ovary 3.9 cm diameter.  Nonvisualization of LEFT ovary and endometrial complex.  Tubo-ovarian abscess seen on previous exam resolved.   Electronically  Signed   By: Lavonia Dana M.D.   On: 09/13/2019 12:09  Assessment: pt stable to improved  Plan:  1. Fibroids I told her that at this point the only options would be surgical. She doesn't have any climateric s/s and I told her that Lupron could only be used for 6 months and I suspect that once stopping the Lupron the s/s would likely come back. I also told her that I don't think that depo  provera or progestin only pills would help given the size and location of the fibroid and that it may make it worse. She'd like to continue on the [facility] for now; I told her to do it for the full 5 days.  I also told her I recommend repeat CBC to see levels; if bleeding with periods isn't too bad then should be back to normal or close to it considering she got blood in the hospital and IV iron afterwards. If still shows moderate anemia, I told her I would again recommend surgical options, which would range from hysteroscopy myomectomy, hysterectomy and IR consult to talk about feasibility of [place]. 70m refill for Lysteda sent in.  - CBC   RTC: PRN  Durene Romans MD Attending Center for [facility])

## 2019-09-17 ENCOUNTER — Encounter: Payer: Self-pay | Admitting: Obstetrics and Gynecology

## 2019-09-30 ENCOUNTER — Other Ambulatory Visit: Payer: Self-pay | Admitting: Obstetrics & Gynecology

## 2020-04-25 DIAGNOSIS — Z20822 Contact with and (suspected) exposure to covid-19: Secondary | ICD-10-CM | POA: Diagnosis not present

## 2020-05-18 DIAGNOSIS — Z20822 Contact with and (suspected) exposure to covid-19: Secondary | ICD-10-CM | POA: Diagnosis not present

## 2020-07-05 IMAGING — CT CT ABD-PELV W/ CM
2 of 5 series · 14 of 46 positions shown, 16 images · IV contrast (APPLIED)
Comparison: None.

CLINICAL DATA: Acute lower abdominal pain since this morning with
chills and low-grade fever.

EXAM:
CT ABDOMEN AND PELVIS WITH CONTRAST
TECHNIQUE: Multidetector CT imaging of the abdomen and pelvis was performed
using the standard protocol following bolus administration of
intravenous contrast.
CONTRAST:  100mL OMNIPAQUE IOHEXOL 300 MG/ML  SOLN

[Series 3: abd/ pelvis 5.0 i30f 2 · axial · 0.74mm/px · z∈[+1000,+1424]mm · 11 of 96 slices shown, 13 images]
[im 6/96  soft-tissue]
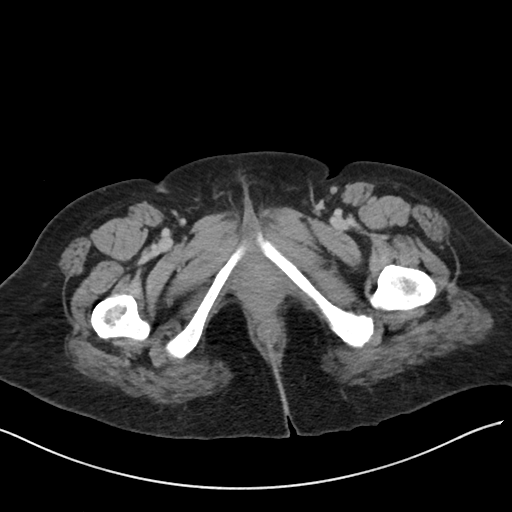
[im 6/96  bone]
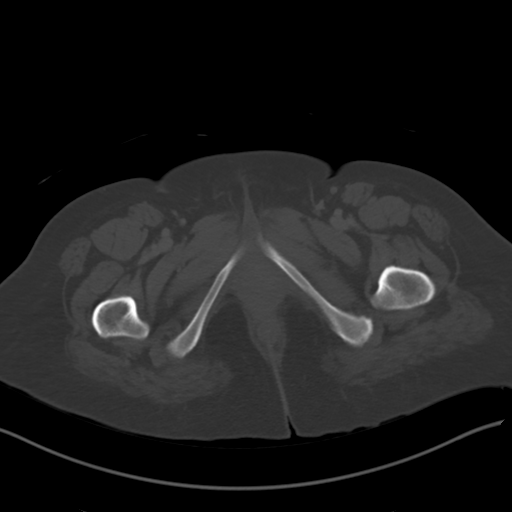
[im 16/96  soft-tissue]
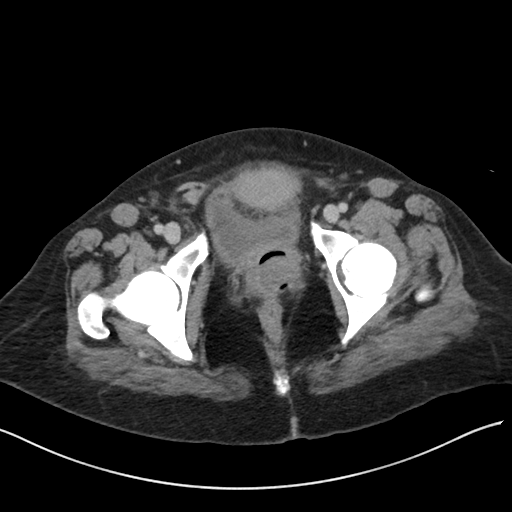
[im 26/96  soft-tissue]
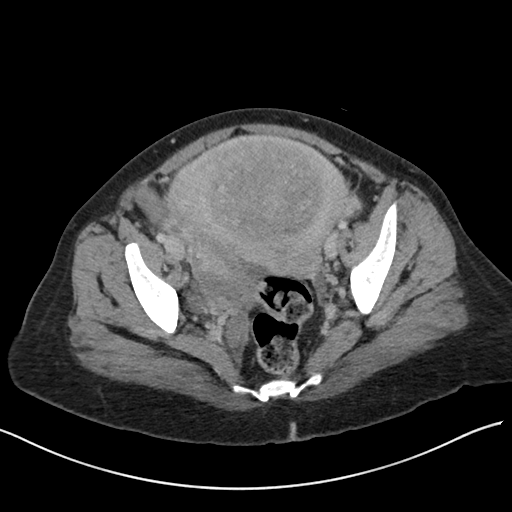
[im 31/96  soft-tissue]
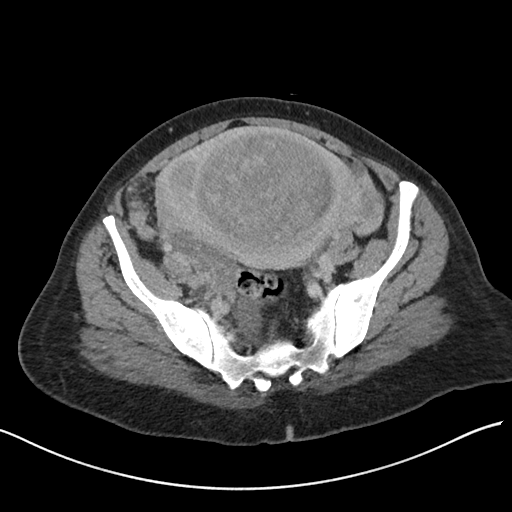
[im 41/96  soft-tissue]
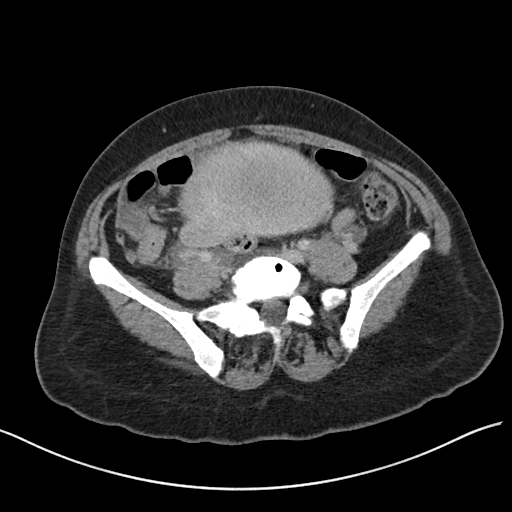
[im 51/96  soft-tissue]
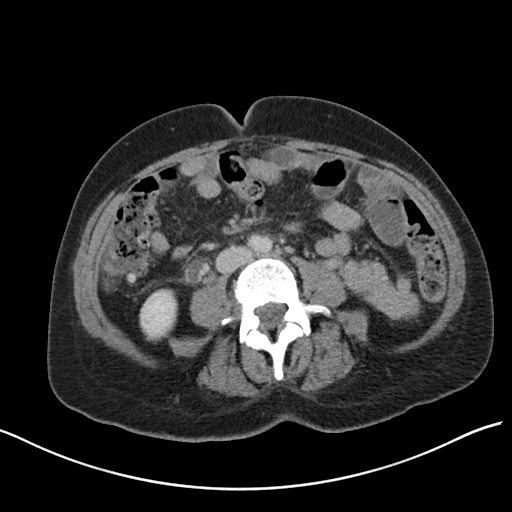
[im 56/96  soft-tissue]
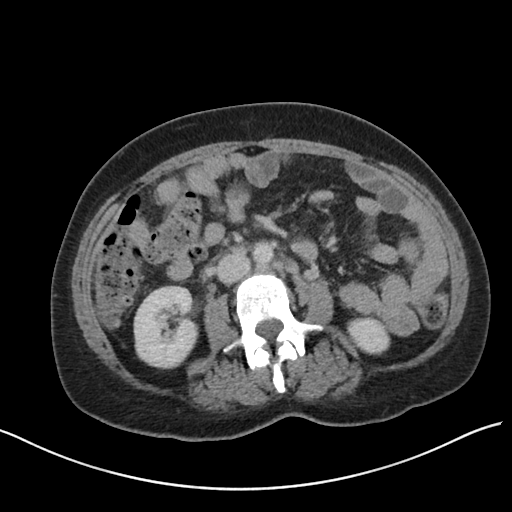
[im 66/96  soft-tissue]
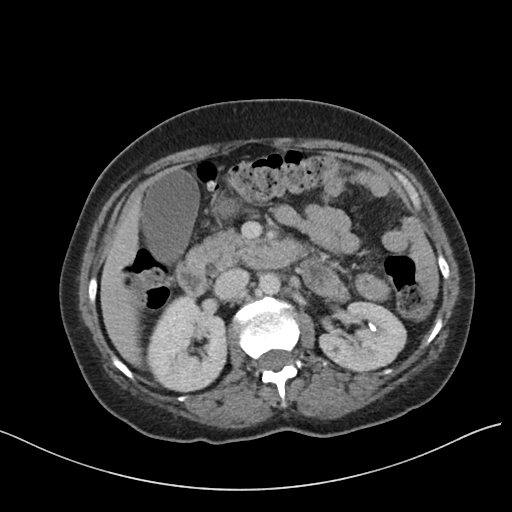
[im 71/96  soft-tissue]
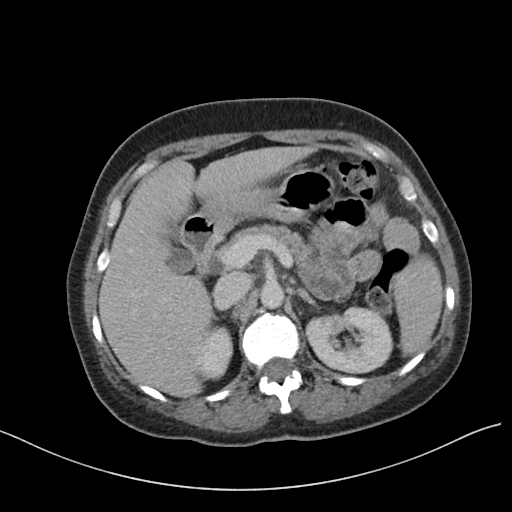
[im 71/96  bone]
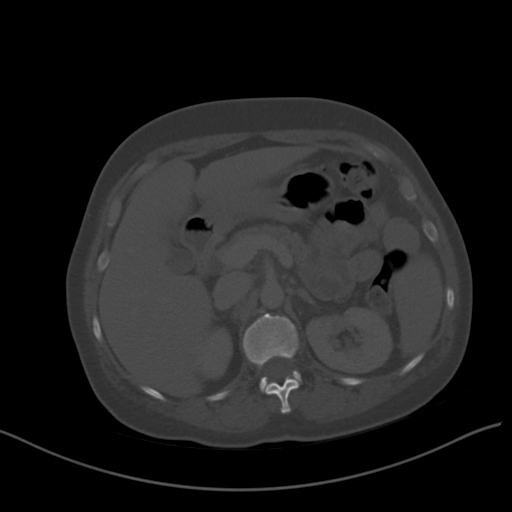
[im 81/96  soft-tissue]
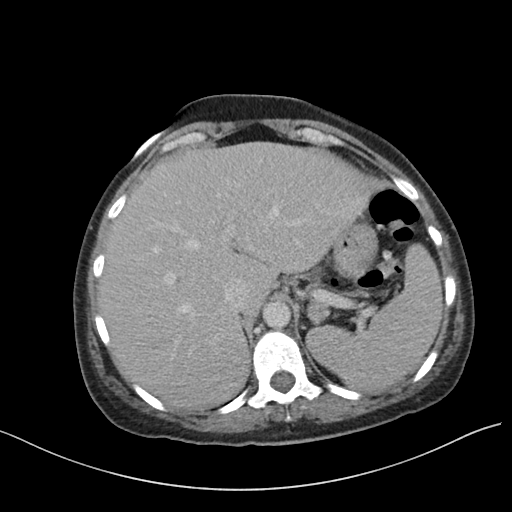
[im 91/96  soft-tissue]
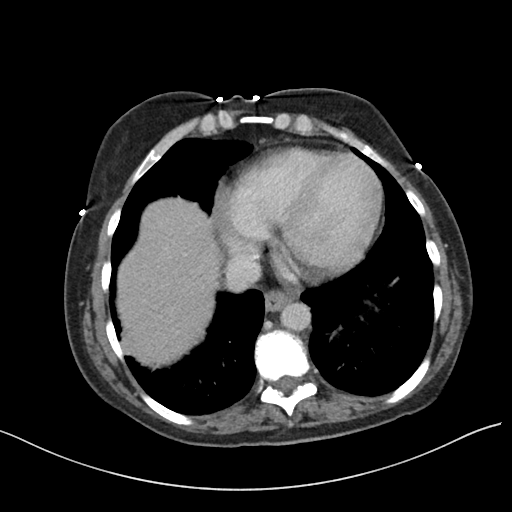

[Series 6: coronal soft tissue · coronal · 0.72mm/px · 3 of 94 slices shown]
[im 32/94  soft-tissue]
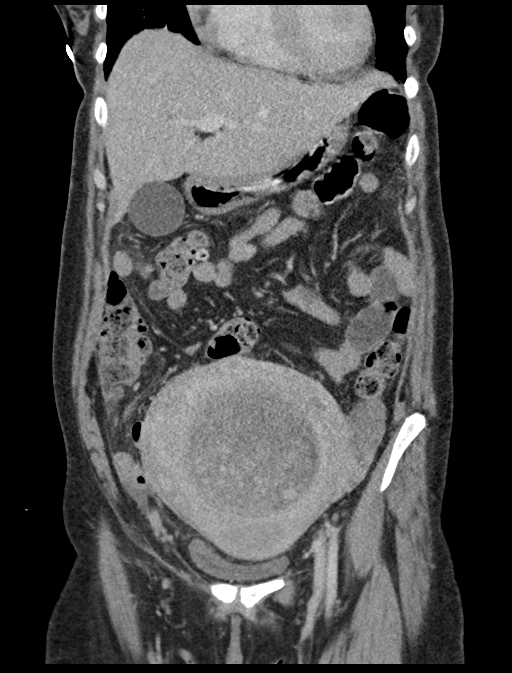
[im 42/94  soft-tissue]
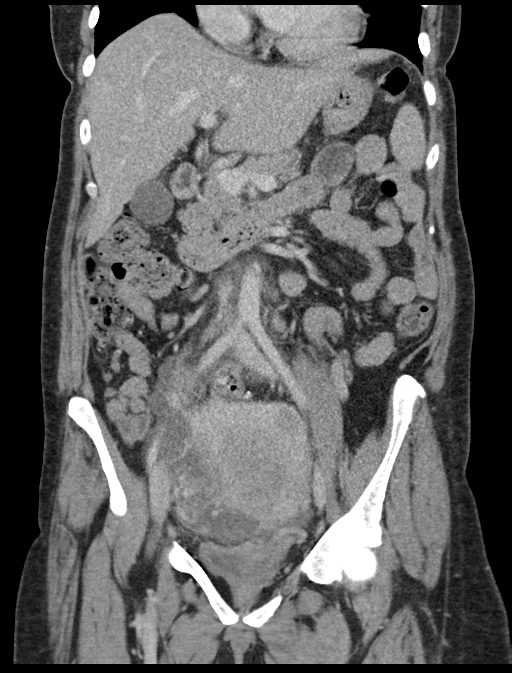
[im 52/94  soft-tissue]
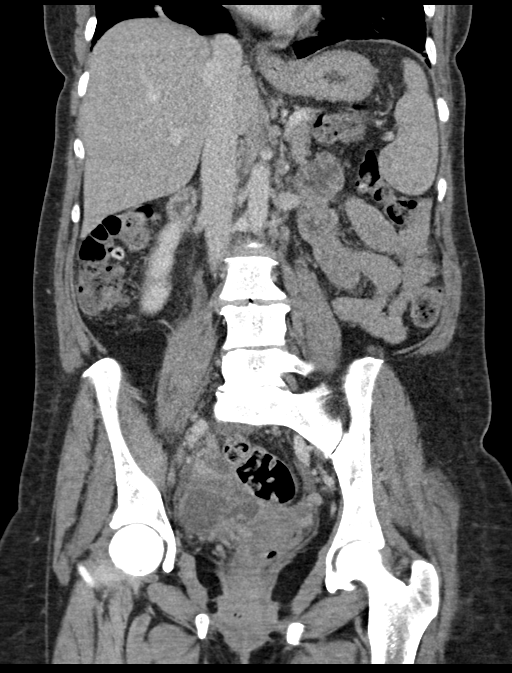

[14 of 46 positions shown; findings below may reference images not displayed]

FINDINGS: Lower chest: Streaky bibasilar atelectasis but no infiltrates or
effusions. The heart is within normal limits in size. No pericardial
effusion. The distal esophagus is grossly normal.

Hepatobiliary: No focal hepatic lesions or intrahepatic biliary
dilatation. The portal and hepatic veins are patent. The gallbladder
is unremarkable. Slightly prominent common bile duct measuring a
maximum of 9 mm in the porta hepatis. Is also 9 mm in the head of
the pancreas. I do not see an obvious obstructing pancreatic head
mass, common bile duct stone or ampullary lesion. Recommend
correlation with liver function studies. If these are abnormal
patient may need ERCP or MRCP for further evaluation.

Pancreas: No mass, inflammation or ductal dilatation.

Spleen: Normal size.  No focal lesions.

Adrenals/Urinary Tract: The adrenal glands are normal.

There are bilateral midpole renal calculi but I do not see any
obstructing ureteral calculi or bladder calculi. Duplicated
collecting system on the right side is noted but the ureters join
quickly proximally.

Stomach/Bowel: The stomach, duodenum, small bowel and colon are
grossly normal without oral contrast. No acute inflammatory changes,
mass lesions or obstructive findings. The terminal ileum is normal.
The appendix is normal. There is diffuse colonic diverticulosis but
no definite findings for acute diverticulitis.

Vascular/Lymphatic: The aorta and branch vessels are unremarkable.
The major venous structures are patent. Circumaortic left renal vein
is noted.

There are borderline enlarged retroperitoneal lymph nodes along with
some hazy interstitial changes. This is likely
inflammatory/infectious adenopathy.

Reproductive: Markedly enlarged fibroid uterus with a dominant
fibroid involving the anterior myometrium and measuring 9.8 x 8.2 x
10 cm. It demonstrates some component of degeneration. There is
moderate compression and flattening of the endometrium posteriorly.

The left ovary appears normal. There is a small cyst or follicle
noted. The right ovary appears to contain a simple cyst measuring
3.3 cm. However, around the right ovary is a sizable area of PID or
abscess. Irregular fluid collections surrounding parametrial vessels
and rim like enhancement suggesting tubo-ovarian abscess or PID.

Other: Small scattered pelvic nodes but no adenopathy. The patient
is a small right inguinal hernia containing fluid.

Musculoskeletal: No significant bony findings.
IMPRESSION: 1. CT findings highly suspicious for right-sided tubo-ovarian
abscess or PID. Several likely connected fluid collection
surrounding the right ovary which contains a simple appearing cyst.
There is also moderate inflammatory interstitial changes around this
area and extending up around the right ovarian vein and into the
retroperitoneum.
2. Bilateral renal calculi but no obstructing ureteral calculi or
findings for pyelonephritis.
3. Common bile duct dilatation of uncertain significance. Recommend
correlation with liver function studies. If these are abnormal
patient may need MRCP for further evaluation. No obvious gallstones
or findings for acute cholecystitis.
4. Markedly enlarged fibroid uterus with a large dominant fibroid in
the anterior myometrium.

## 2020-08-27 DIAGNOSIS — Z03818 Encounter for observation for suspected exposure to other biological agents ruled out: Secondary | ICD-10-CM | POA: Diagnosis not present

## 2020-08-27 DIAGNOSIS — J00 Acute nasopharyngitis [common cold]: Secondary | ICD-10-CM | POA: Diagnosis not present

## 2020-10-26 DIAGNOSIS — Z03818 Encounter for observation for suspected exposure to other biological agents ruled out: Secondary | ICD-10-CM | POA: Diagnosis not present

## 2020-10-26 DIAGNOSIS — R1084 Generalized abdominal pain: Secondary | ICD-10-CM | POA: Diagnosis not present

## 2020-10-26 DIAGNOSIS — R000 Tachycardia, unspecified: Secondary | ICD-10-CM | POA: Diagnosis not present
